# Patient Record
Sex: Male | Born: 1965 | Race: White | Hispanic: No | Smoking: Never smoker
Health system: Southern US, Community
[De-identification: ages and names within clinical notes are randomized; demographics above are authoritative.]

## PROBLEM LIST (undated history)

## (undated) DIAGNOSIS — E1165 Type 2 diabetes mellitus with hyperglycemia: Secondary | ICD-10-CM

## (undated) DIAGNOSIS — IMO0002 Reserved for concepts with insufficient information to code with codable children: Secondary | ICD-10-CM

## (undated) DIAGNOSIS — E785 Hyperlipidemia, unspecified: Secondary | ICD-10-CM

## (undated) DIAGNOSIS — F32A Depression, unspecified: Secondary | ICD-10-CM

## (undated) DIAGNOSIS — F329 Major depressive disorder, single episode, unspecified: Secondary | ICD-10-CM

## (undated) DIAGNOSIS — I10 Essential (primary) hypertension: Secondary | ICD-10-CM

## (undated) DIAGNOSIS — E119 Type 2 diabetes mellitus without complications: Secondary | ICD-10-CM

## (undated) HISTORY — DX: Essential (primary) hypertension: I10

## (undated) HISTORY — DX: Major depressive disorder, single episode, unspecified: F32.9

## (undated) HISTORY — PX: TONSILECTOMY/ADENOIDECTOMY WITH MYRINGOTOMY: SHX6125

## (undated) HISTORY — DX: Depression, unspecified: F32.A

## (undated) HISTORY — DX: Type 2 diabetes mellitus without complications: E11.9

## (undated) HISTORY — DX: Hyperlipidemia, unspecified: E78.5

---

## 2004-08-12 ENCOUNTER — Emergency Department: Payer: Self-pay | Admitting: Emergency Medicine

## 2005-09-05 ENCOUNTER — Emergency Department: Payer: Self-pay | Admitting: Emergency Medicine

## 2006-02-18 ENCOUNTER — Emergency Department: Payer: Self-pay | Admitting: Emergency Medicine

## 2006-07-30 ENCOUNTER — Other Ambulatory Visit: Payer: Self-pay

## 2006-07-30 ENCOUNTER — Emergency Department: Payer: Self-pay | Admitting: Emergency Medicine

## 2008-03-23 ENCOUNTER — Emergency Department: Payer: Self-pay | Admitting: Emergency Medicine

## 2009-12-07 IMAGING — CT CT HEAD WITHOUT CONTRAST
2 series · 16 of 30 positions shown, 20 images · non-contrast
Comparison: none

REASON FOR EXAM: facial droop
COMMENTS:

[Series 2: without · axial · non-contrast · 0.42mm/px · z∈[+399,+524]mm · 13 of 31 slices shown, 17 images]
[im 3/31  brain]
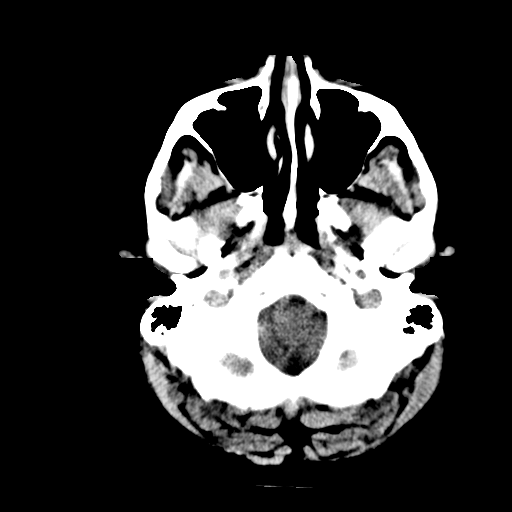
[im 3/31  bone]
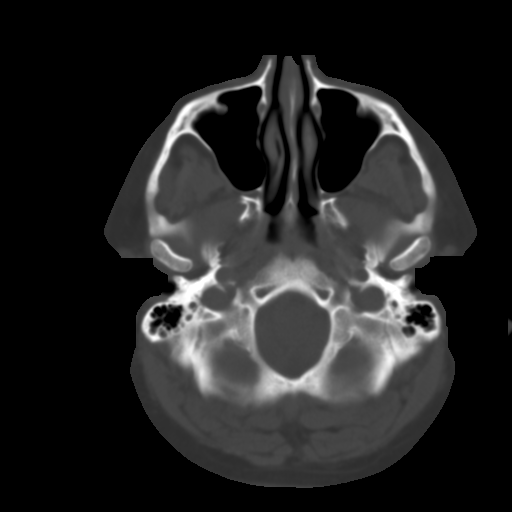
[im 5/31  brain]
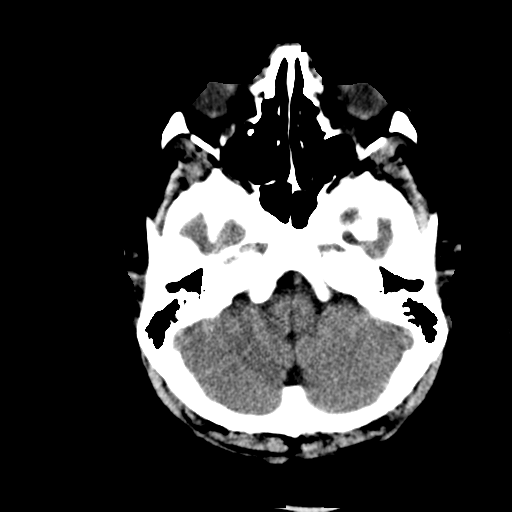
[im 7/31  brain]
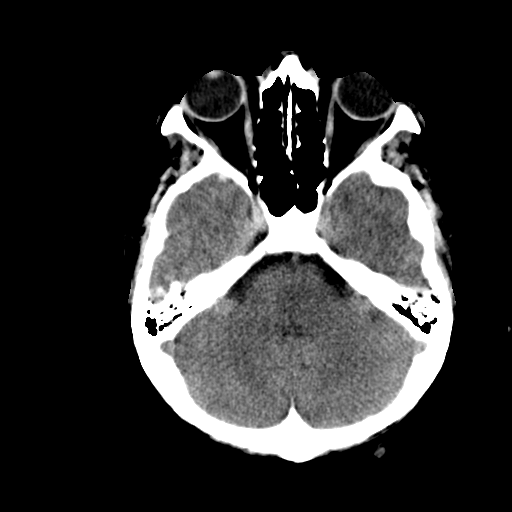
[im 9/31  brain]
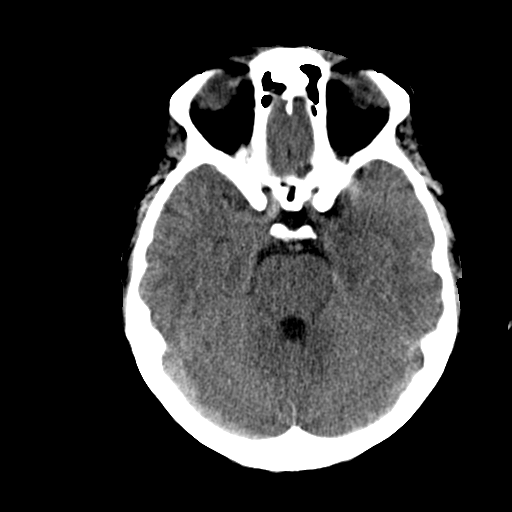
[im 11/31  brain]
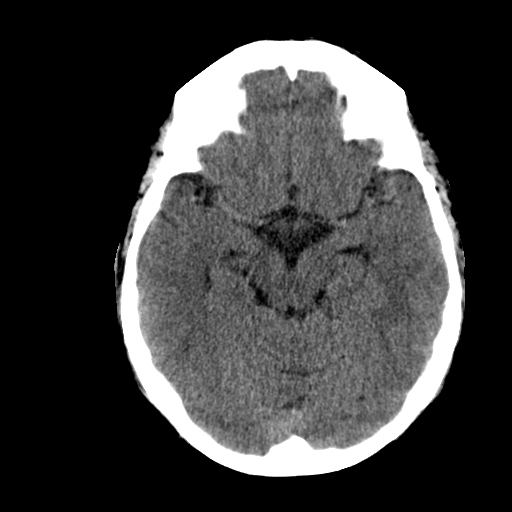
[im 11/31  bone]
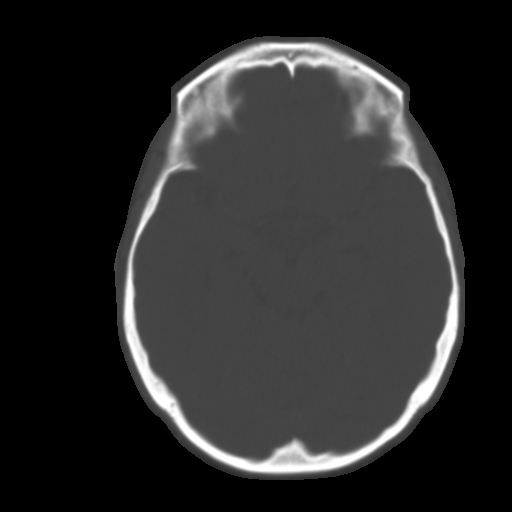
[im 13/31  brain]
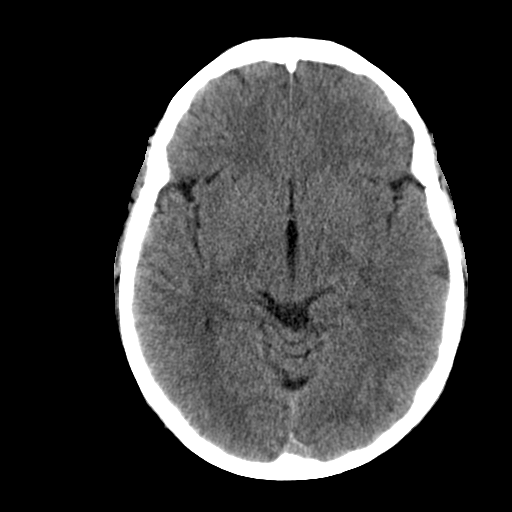
[im 16/31  brain]
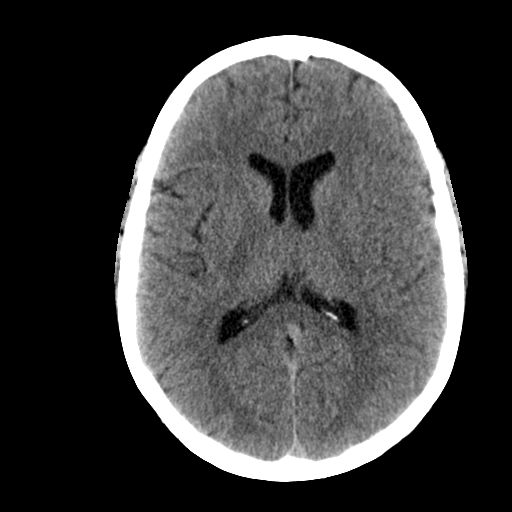
[im 18/31  brain]
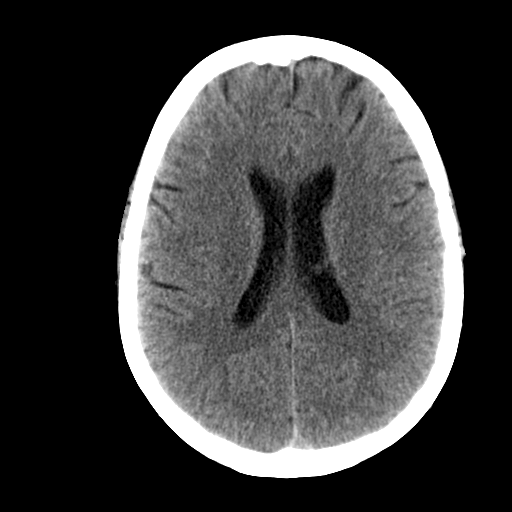
[im 20/31  brain]
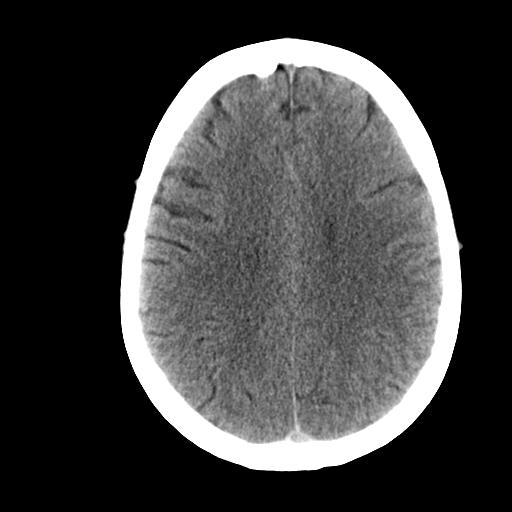
[im 20/31  bone]
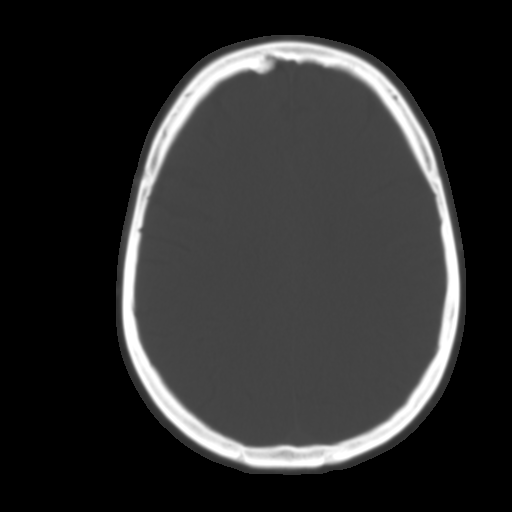
[im 22/31  brain]
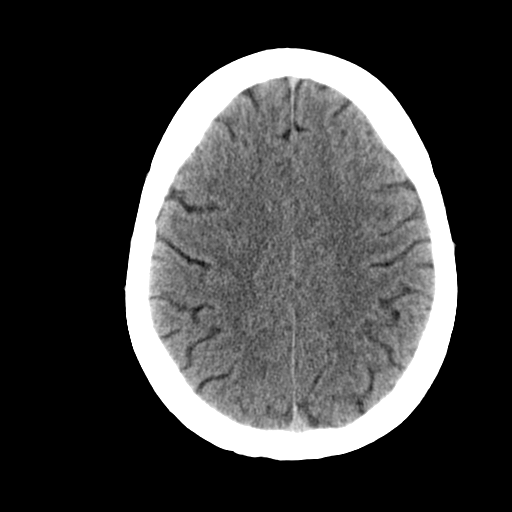
[im 24/31  brain]
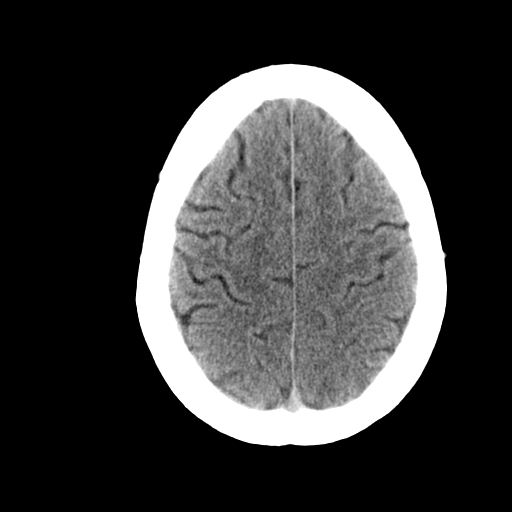
[im 26/31  brain]
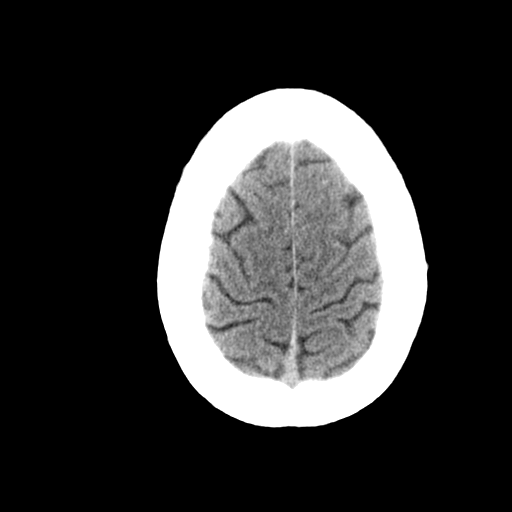
[im 28/31  brain]
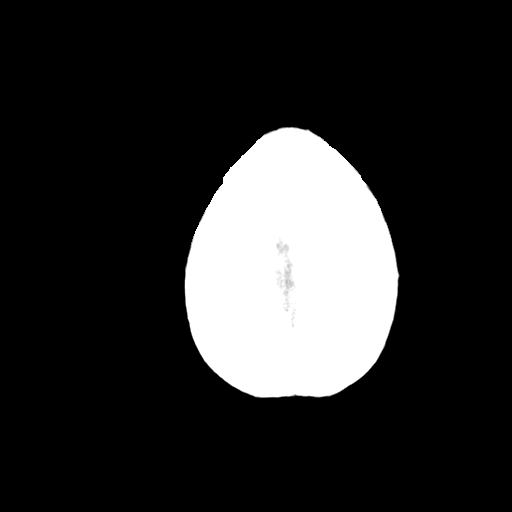
[im 28/31  bone]
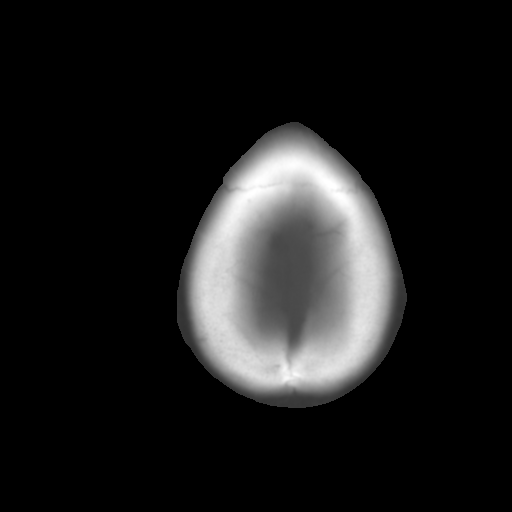

[Series 3: bone · axial · 0.42mm/px · z∈[+399,+439]mm · 3 of 31 slices shown]
[im 3/31  bone]
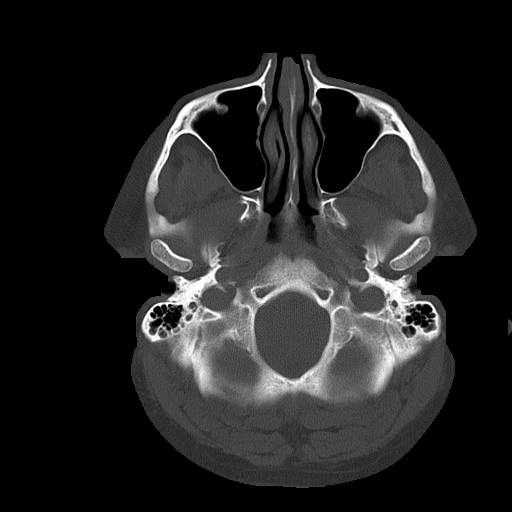
[im 7/31  bone]
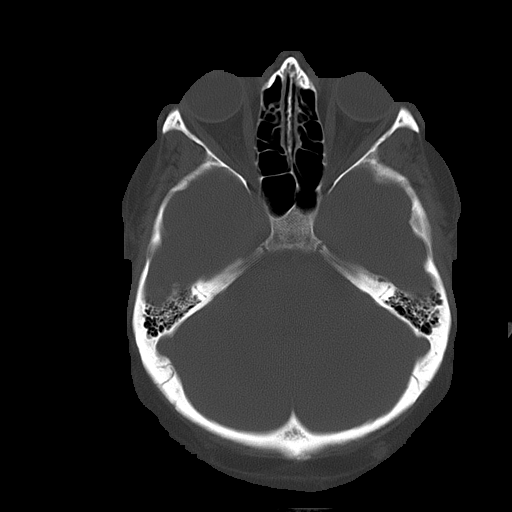
[im 11/31  bone]
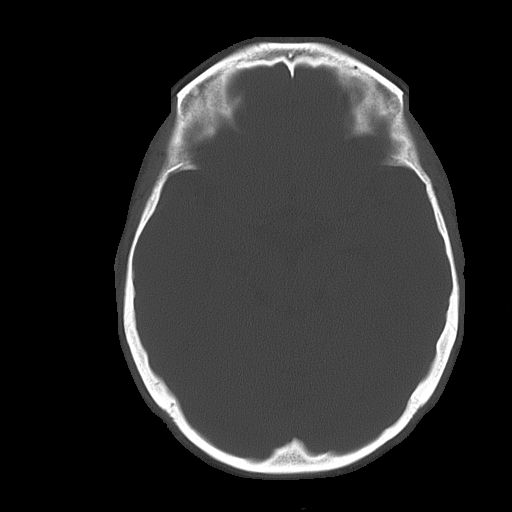

[16 of 30 positions shown; findings below may reference images not displayed]

PROCEDURE:     CT  - CT HEAD WITHOUT CONTRAST  - March 23, 2008  [DATE]

RESULT:     Noncontrast emergent CT of the brain is performed. The patient
has no previous exam for comparison.

The ventricles and sulci are normal. There is no hemorrhage. There is no
focal mass, mass-effect or midline shift. There is no evidence of edema or
territorial infarct. The bone windows demonstrate normal aeration of the
paranasal sinuses and mastoid air cells. There is no skull fracture
demonstrated.
IMPRESSION: 1. No acute intracranial abnormality.

## 2011-07-09 ENCOUNTER — Emergency Department: Payer: Self-pay | Admitting: Emergency Medicine

## 2011-07-09 LAB — URINALYSIS, COMPLETE
Bacteria: NONE SEEN
Blood: NEGATIVE
Ketone: NEGATIVE
Nitrite: NEGATIVE
Ph: 7 (ref 4.5–8.0)
Protein: NEGATIVE

## 2011-07-09 LAB — COMPREHENSIVE METABOLIC PANEL
BUN: 12 mg/dL (ref 7–18)
Bilirubin,Total: 0.4 mg/dL (ref 0.2–1.0)
Calcium, Total: 8.8 mg/dL (ref 8.5–10.1)
Chloride: 104 mmol/L (ref 98–107)
Creatinine: 0.71 mg/dL (ref 0.60–1.30)
EGFR (Non-African Amer.): >60

## 2011-07-09 LAB — LIPASE, BLOOD: Lipase: 75 U/L (ref 73–393)

## 2011-07-09 LAB — CBC
MCH: 29.6 pg (ref 26.0–34.0)
RDW: 13.1 % (ref 11.5–14.5)

## 2012-06-03 ENCOUNTER — Ambulatory Visit: Payer: Self-pay | Admitting: General Surgery

## 2012-06-07 ENCOUNTER — Ambulatory Visit: Payer: Self-pay | Admitting: General Surgery

## 2012-06-23 ENCOUNTER — Ambulatory Visit: Payer: Self-pay | Admitting: General Surgery

## 2012-07-19 ENCOUNTER — Ambulatory Visit: Payer: Self-pay | Admitting: General Surgery

## 2012-08-02 ENCOUNTER — Ambulatory Visit (INDEPENDENT_AMBULATORY_CARE_PROVIDER_SITE_OTHER): Payer: BC Managed Care – PPO | Admitting: General Surgery

## 2012-08-02 ENCOUNTER — Encounter: Payer: Self-pay | Admitting: General Surgery

## 2012-08-02 VITALS — BP 140/70 | HR 70 | Resp 16 | Ht 64.0 in | Wt 369.0 lb

## 2012-08-02 DIAGNOSIS — L987 Excessive and redundant skin and subcutaneous tissue: Secondary | ICD-10-CM

## 2012-08-02 DIAGNOSIS — L918 Other hypertrophic disorders of the skin: Secondary | ICD-10-CM

## 2012-08-02 NOTE — Patient Instructions (Addendum)
Patient to see a Engineer, petroleum. Have asked pt to discuss with his PCP.

## 2012-08-02 NOTE — Progress Notes (Signed)
Patient ID: Jack Owens, male   DOB: [birth year], 47 y.o.   MRN: [id number]  Chief Complaint  Patient presents with  . Abdominal Pain    HPI Jack Owens is a 47 y.o. male  Here for assessment of left leg and groin mass. He reports has had this for the past 5 years. Patient states the area does change size. Patient states the pain comes and goes and is a 5 to 6 on the pain scale. Pt is obese and has lost 80lbs over past 1-2 yrs. He noted the swelling become more pronounced with wt loss. Abdominal Pain    Past Medical History  Diagnosis Date  . Diabetes mellitus without complication   . Hyperlipidemia   . Hypertension   . Depression     Past Surgical History  Procedure Laterality Date  . Tonsilectomy/adenoidectomy with myringotomy  as child    History reviewed. No pertinent family history.  Social History History  Substance Use Topics  . Smoking status: Never Smoker   . Smokeless tobacco: Never Used  . Alcohol Use: No    No Known Allergies  Current Outpatient Prescriptions  Medication Sig Dispense Refill  . citalopram (CELEXA) 20 MG tablet Take 20 mg by mouth daily.      . hydrochlorothiazide (HYDRODIURIL) 25 MG tablet Take 25 mg by mouth daily.      Marland Kitchen lovastatin (MEVACOR) 40 MG tablet Take 40 mg by mouth at bedtime.      . metFORMIN (GLUCOPHAGE) 500 MG tablet Take 500 mg by mouth 2 (two) times daily with a meal.       No current facility-administered medications for this visit.    Review of Systems Review of Systems  Constitutional: Negative.   Respiratory: Negative.   Cardiovascular: Negative.   Gastrointestinal: Negative.     Blood pressure 140/70, pulse 70, resp. rate 16, height 5\' 4"  (1.626 m), weight 369 lb (167.377 kg).  Physical Exam Physical Exam  Constitutional: He is oriented to person, place, and time. He appears well-developed and well-nourished.  Messily obesity   Cardiovascular: Intact distal pulses and normal pulses.   Pulses:      Dorsalis  pedis pulses are 2+ on the right side, and 2+ on the left side.       Posterior tibial pulses are 2+ on the right side, and 2+ on the left side.  No edema   Abdominal: Soft. Normal appearance and bowel sounds are normal. There is no tenderness.  Neurological: He is alert and oriented to person, place, and time.  Skin: Skin is warm and dry.  There is a very large pendulous fold of subcutaneous tisue in left left groin, between scrotum and inner left thigh. Easily in excess of 20 cm diameter.   Data Reviewed none Assessment     It is understandable the ;large redundant fold in ths obese individual coud be causing local discomfort with walking. However this is more in line of plastic surgery to assess.      Plan    Patient to see a Engineer, petroleum. Advised in full.         Lysandra Loughmiller G 08/03/2012, 6:48 AM

## 2012-08-03 ENCOUNTER — Encounter: Payer: Self-pay | Admitting: General Surgery

## 2012-08-03 DIAGNOSIS — L987 Excessive and redundant skin and subcutaneous tissue: Secondary | ICD-10-CM | POA: Insufficient documentation

## 2013-03-24 IMAGING — US ABDOMEN ULTRASOUND LIMITED
1 series · 7 of 7 positions shown · non-contrast
Comparison: none

REASON FOR EXAM: rlq
COMMENTS:   Body Site: Appendix/Bowel

PROCEDURE:     US  - US ABDOMEN LIMITED SURVEY  - July 09, 2011  [DATE]
RESULT:

[Series 1: abdomen ultrasound limited · 0.35mm/px · 7 of 7 slices shown]
[im 1/7]
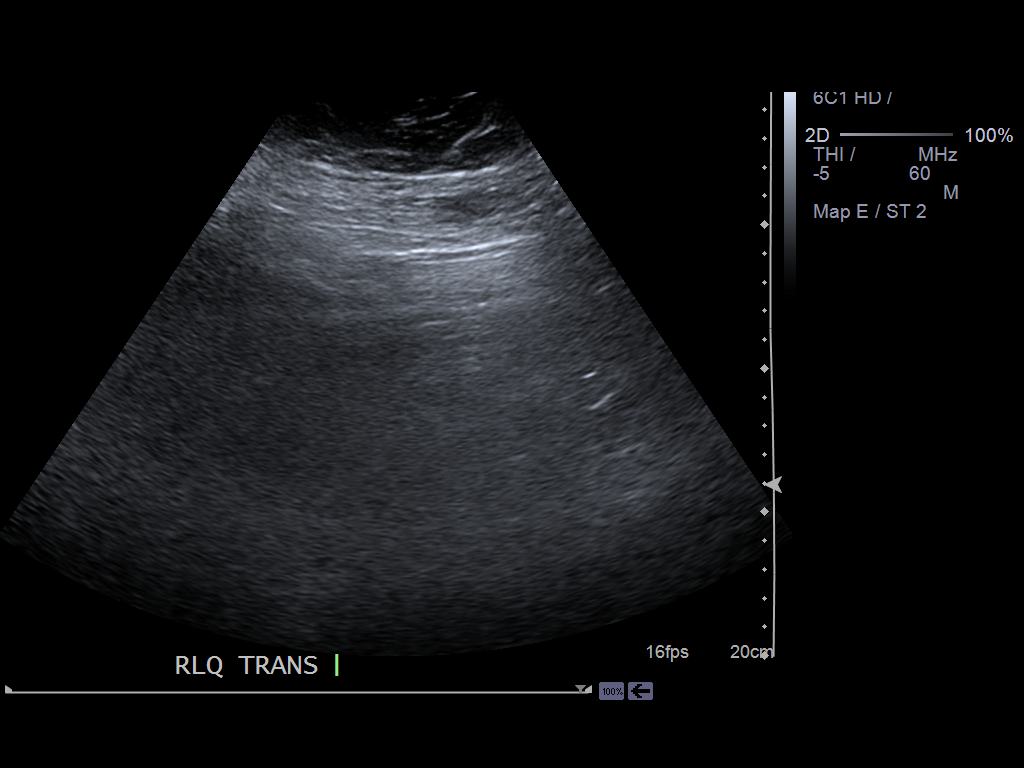
[im 2/7]
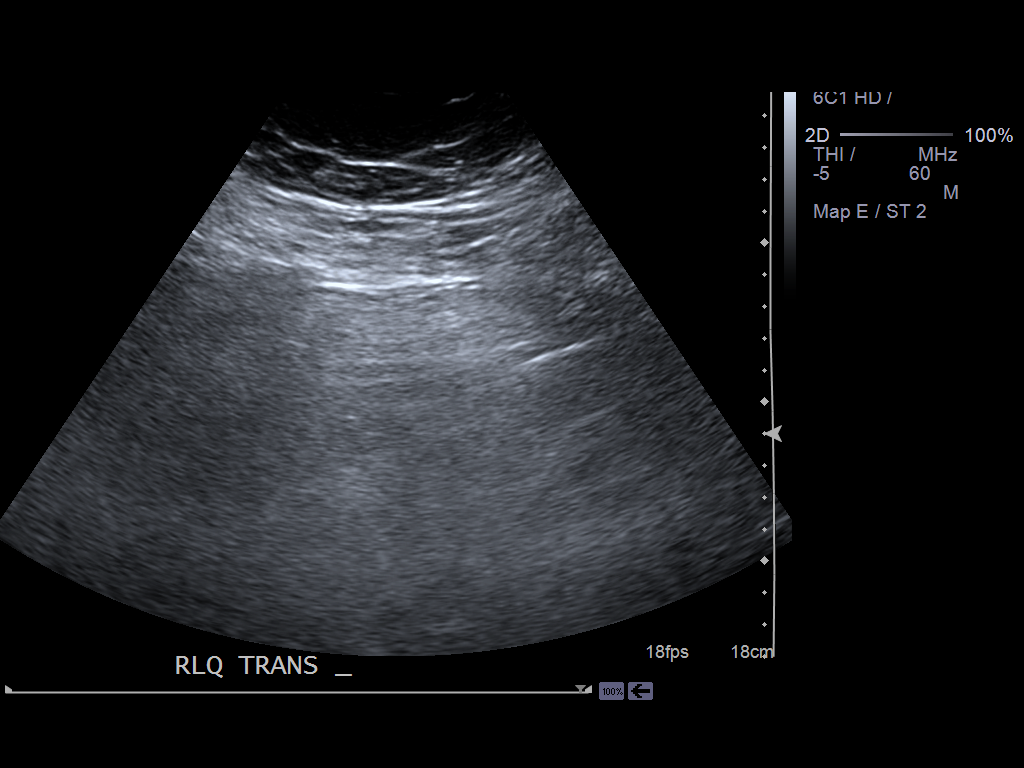
[im 3/7]
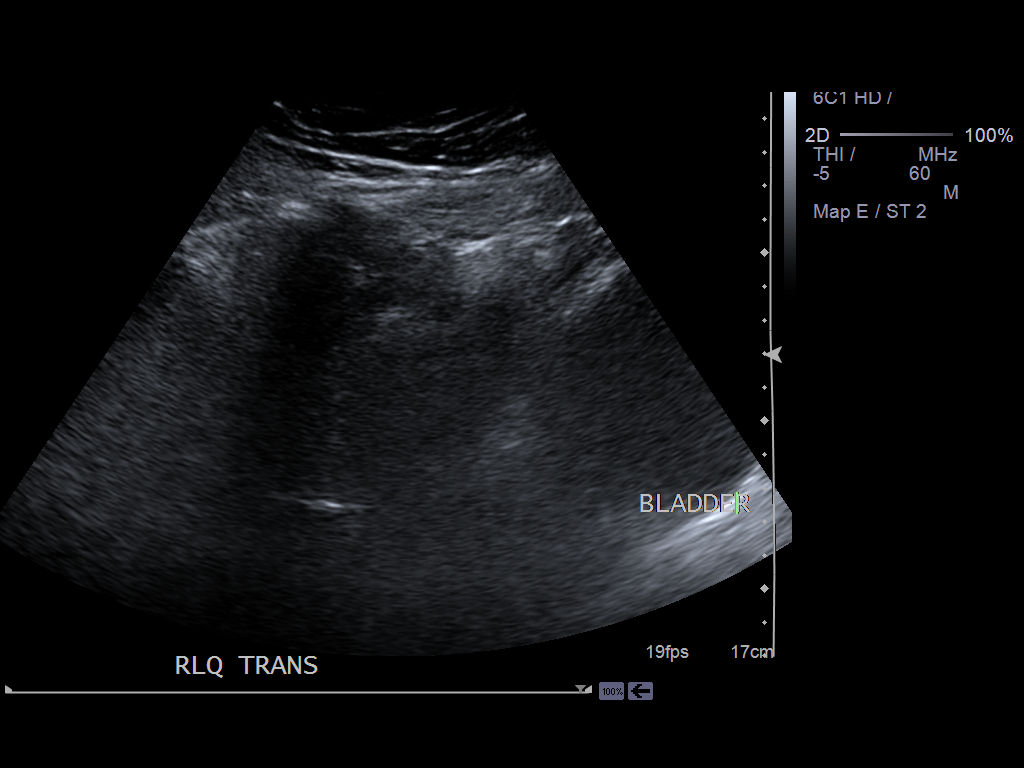
[im 4/7]
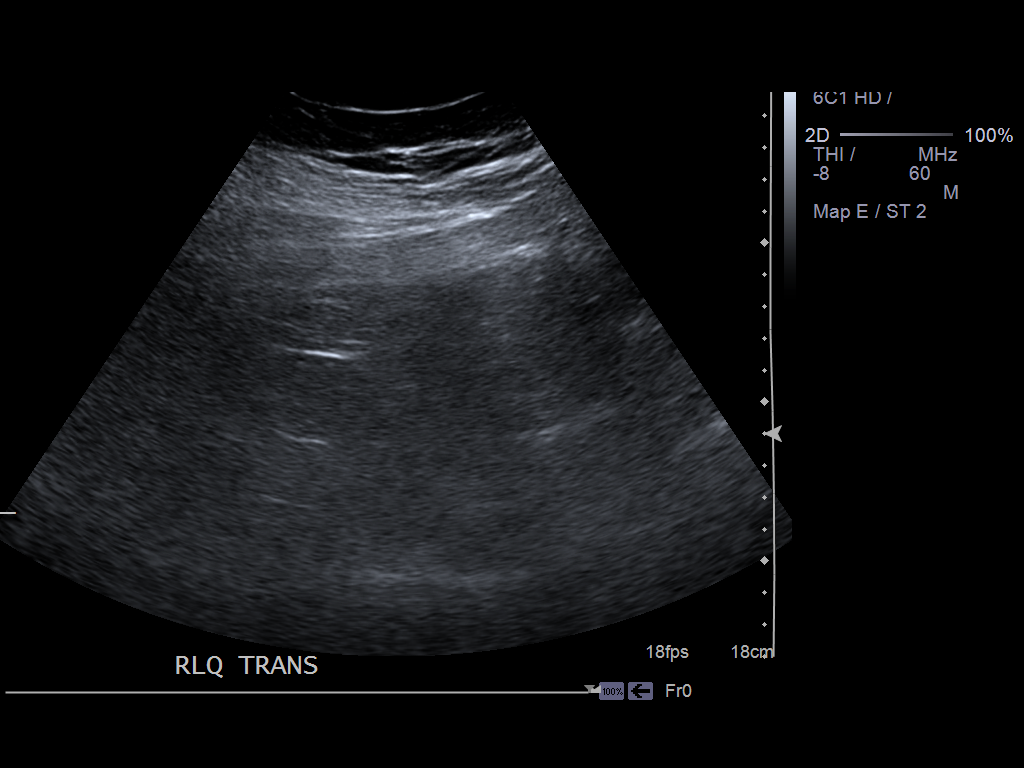
[im 5/7]
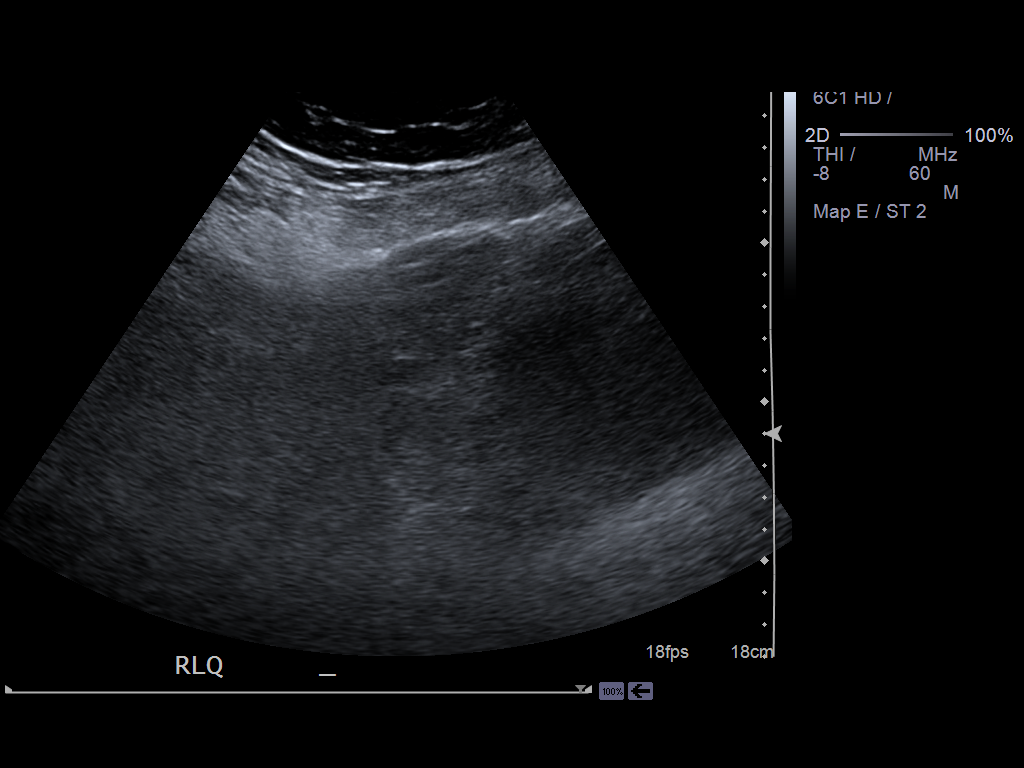
[im 6/7]
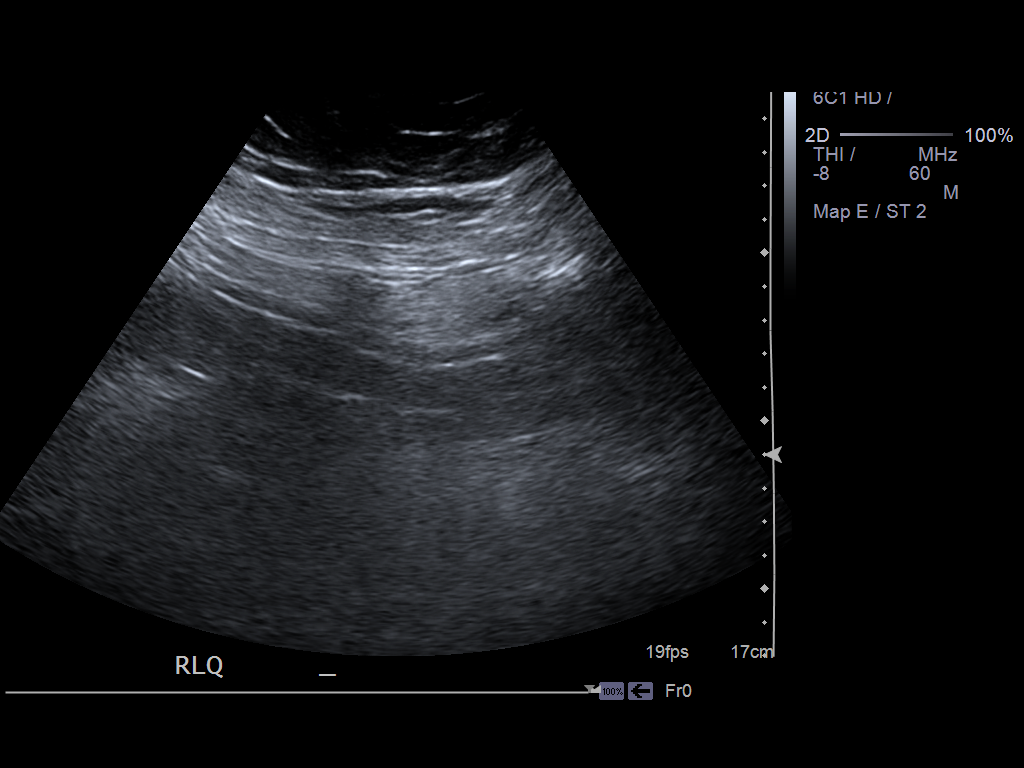
[im 7/7]
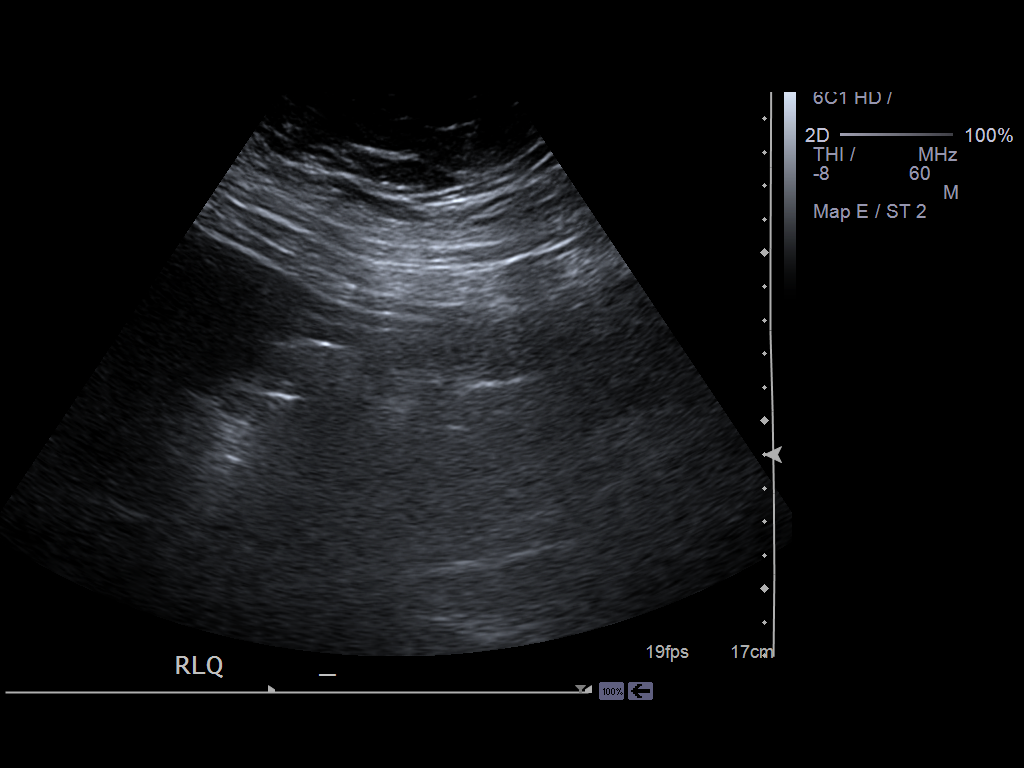

[7 of 7 positions shown; findings below may reference images not displayed]

FINDINGS: Evaluation of the right lower quadrant demonstrates no
sonographically appreciable recognizable structures. This is likely due to
the patient's body habitus, the patient is morbidly obese.
IMPRESSION: Limited right lower quadrant abdominal ultrasound.

## 2013-03-24 IMAGING — US ABDOMEN ULTRASOUND
1 series · 13 of 25 positions shown · non-contrast
Comparison: none

REASON FOR EXAM: rlq pain, possible appy, kidney stone etc.  pt too big
for scanner
COMMENTS:   May transport without cardiac monitor

PROCEDURE:     US  - US ABDOMEN GENERAL SURVEY  - July 09, 2011  [DATE]
RESULT:     Abdominal ultrasound dated 07/09/2011.

[Series 1: abdomen ultrasound · 0.38mm/px · 13 of 84 slices shown]
[im 1/84]
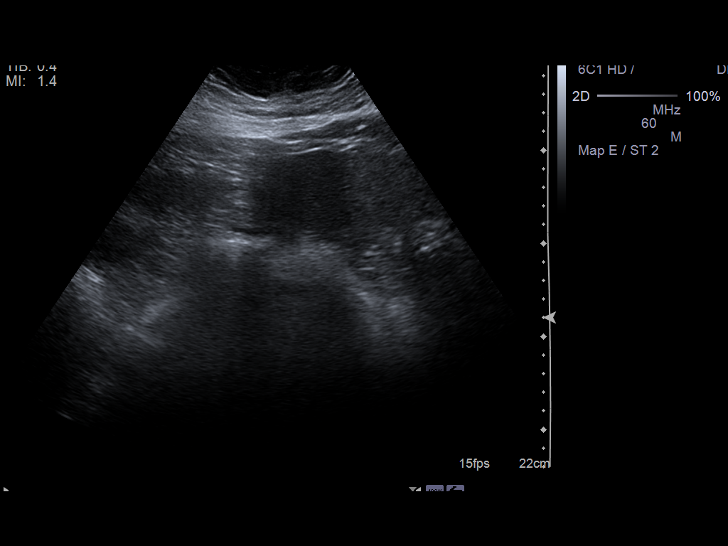
[im 7/84]
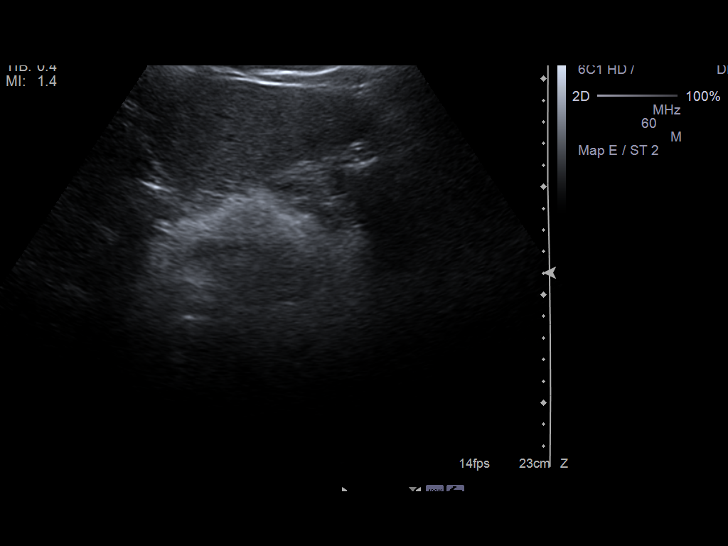
[im 14/84]
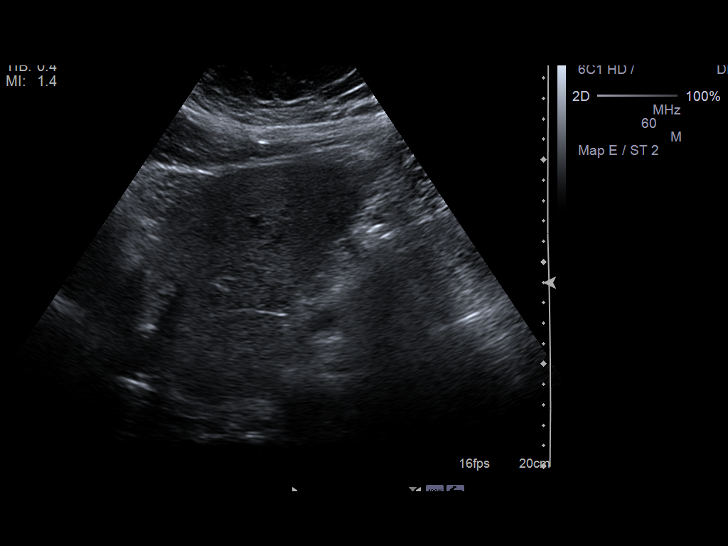
[im 21/84]
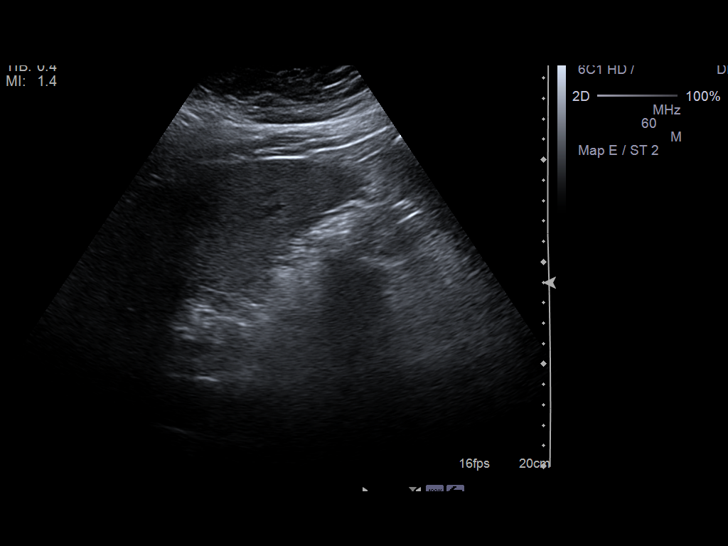
[im 28/84]
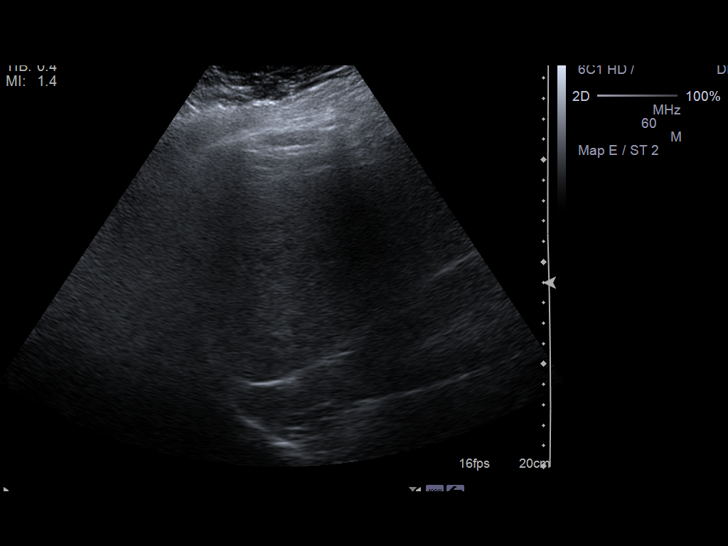
[im 35/84]
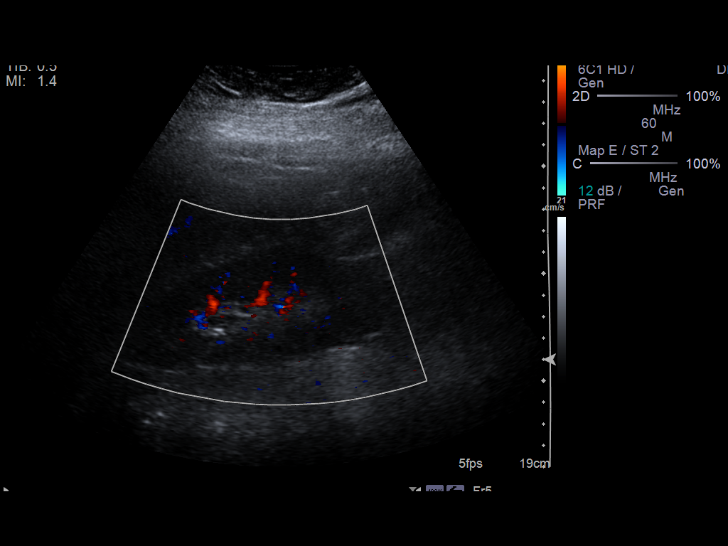
[im 42/84]
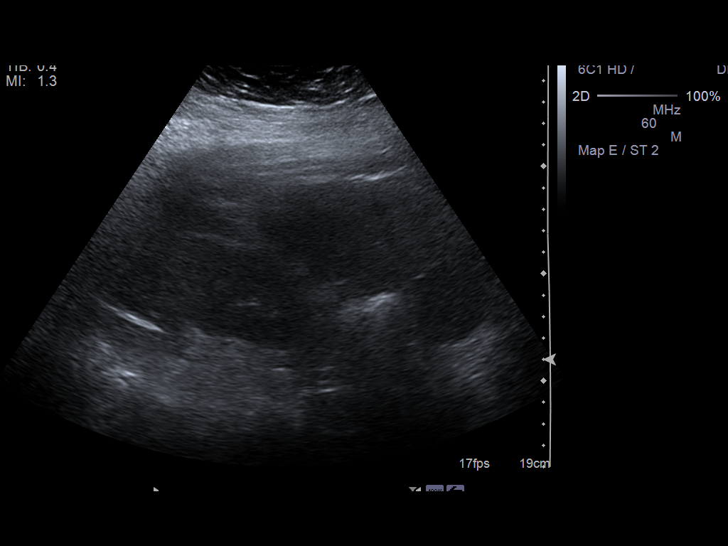
[im 49/84]
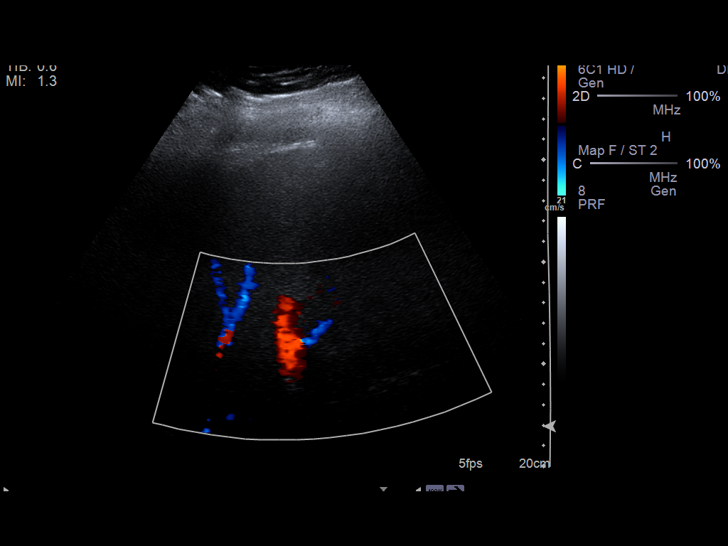
[im 56/84]
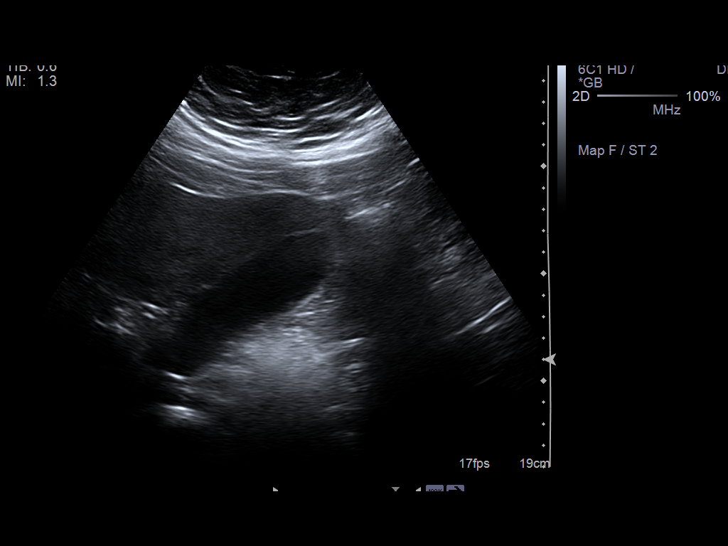
[im 63/84]
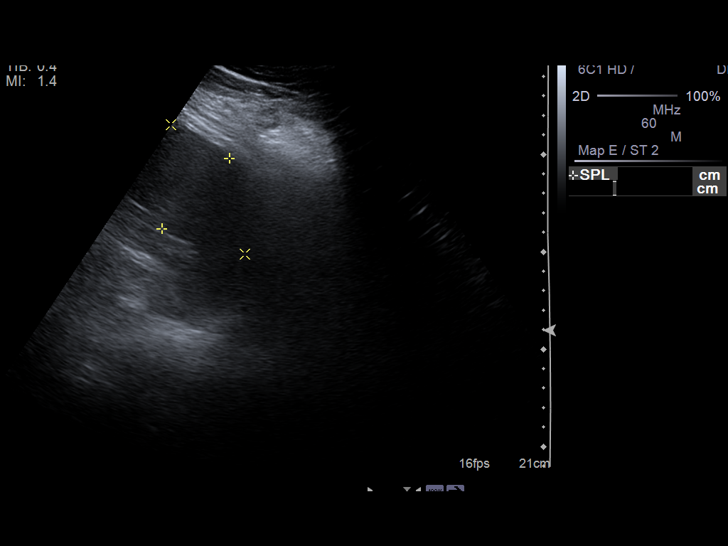
[im 70/84]
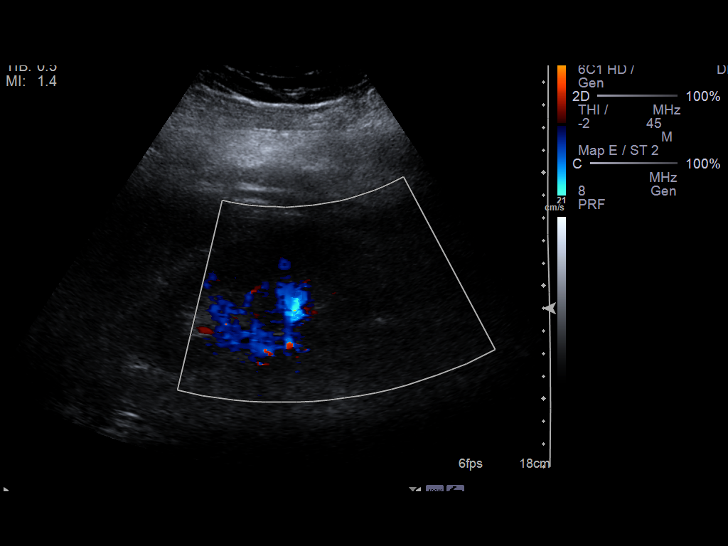
[im 77/84]
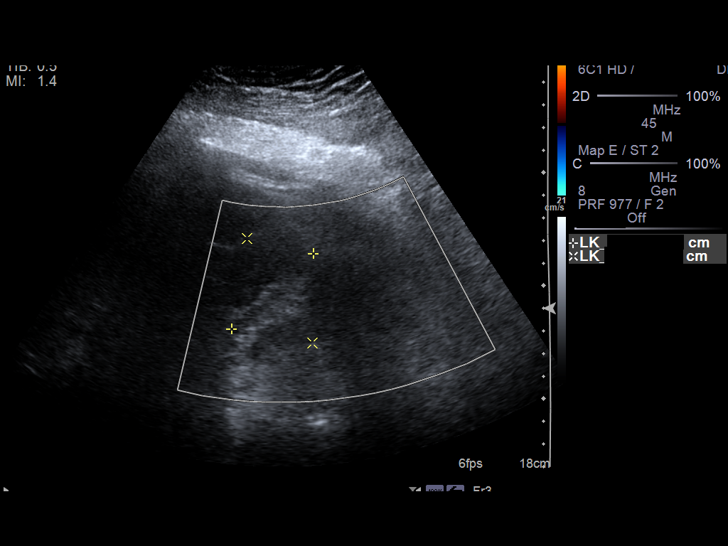
[im 84/84]
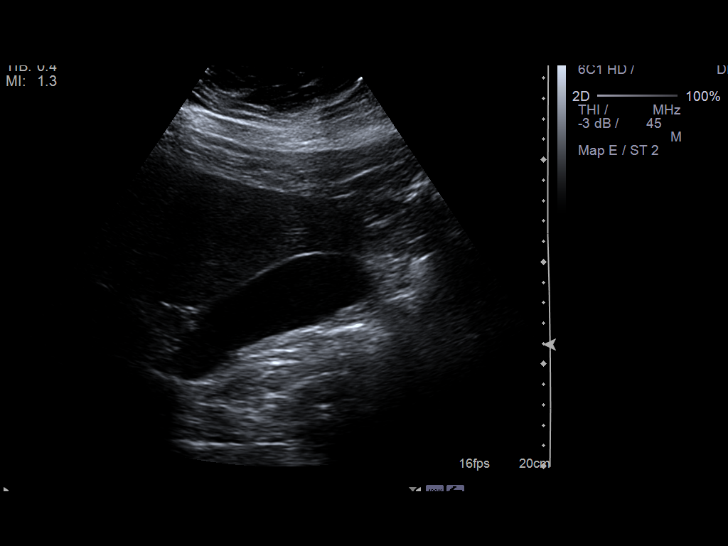

[13 of 25 positions shown; findings below may reference images not displayed]

FINDINGS: The study is markedly degraded secondary to patient body habitus
patient is morbidly obese. The liver demonstrates a heterogeneous
echotexture. The hip no pedal flow is identified within the portal vein.
Visualization of the pancreas is unremarkable. The gallbladder fossa
demonstrates no evidence of pericholecystic fluid, gallstones, gallbladder
wall thickening, sludging nor a sonographic Murphy's sign. Gallbladder wall
thickness is 2.3 mm and the common bile that measures 4.3 mm in diameter.
There is no evidence of intra or extra hepatic biliary ductal dilatation.
Evaluation of the kidneys demonstrates no evidence of hydronephrosis nor
gross evidence of solid or cystic masses. The right kidney measures 12.94 x
4.9 x 4.74 cm and the left 11.44 x 4.92 x 5.5 cm. The spleen is homogeneous
in echotexture measures 10.58 cm in longitudinal dimensions.
IMPRESSION: Hepatic steatosis otherwise unremarkable abdominal
ultrasound.

## 2018-08-24 ENCOUNTER — Ambulatory Visit: Attending: Internal Medicine | Primary: Internal Medicine

## 2018-08-24 ENCOUNTER — Ambulatory Visit: Admit: 2018-08-24 | Attending: Internal Medicine | Primary: Internal Medicine

## 2018-08-24 DIAGNOSIS — Z0000 Encounter for general adult medical examination without abnormal findings: Secondary | ICD-10-CM

## 2018-08-24 LAB — AMB POC URINALYSIS DIP STICK MANUAL W/O MICRO
Bilirubin (UA POC): NEGATIVE
Bilirubin, Urine, POC: NEGATIVE
Blood (UA POC): NEGATIVE
Blood (UA POC): NEGATIVE
Leukocyte Esterase, Urine, POC: NEGATIVE
Leukocyte esterase (UA POC): NEGATIVE
Nitrite, Urine, POC: NEGATIVE
Nitrites (UA POC): NEGATIVE
Protein (UA POC): NEGATIVE
Protein, Urine, POC: NEGATIVE
Specific Gravity, Urine, POC: 1.01 NA (ref 1.001–1.035)
Specific gravity (UA POC): 1.01 (ref 1.001–1.035)
Urobilinogen (UA POC): 0.2 (ref 0.2–1)
Urobilinogen, POC: 0.2 (ref 0.2–1)
pH (UA POC): 5 (ref 4.6–8.0)
pH, Urine, POC: 5 NA (ref 4.6–8.0)

## 2018-08-24 MED ORDER — JANUMET 50 MG-1,000 MG TABLET
50-1000 mg | ORAL_TABLET | Freq: Two times a day (BID) | ORAL | 1 refills | Status: AC
Start: 2018-08-24 — End: ?

## 2018-08-24 NOTE — Progress Notes (Signed)
History and Physical    Samuel Eaton is a 53 y.o. male presents for physical exam.  Was living in [place] for the last 3 yrs, then returned to states 2 yrs. Ago.  Has not had medical insurance until January. Has not had a colonoscopy.    Past Medical History:   Diagnosis Date   ??? Diabetes (HCC)    ??? Neuropathy      History reviewed. No pertinent surgical history.    Not on File  Social History     Socioeconomic History   ??? Marital status: UNKNOWN     Spouse name: Not on file   ??? Number of children: Not on file   ??? Years of education: Not on file   ??? Highest education level: Not on file   Occupational History   ??? Occupation: sales   Tobacco Use   ??? Smoking status: Never Smoker   ??? Smokeless tobacco: Never Used   Substance and Sexual Activity   ??? Alcohol use: Yes     Alcohol/week: 2.0 standard drinks     Types: 2 Glasses of wine per week     Frequency: Monthly or less     Family History   Problem Relation Age of Onset   ??? Lung Cancer Mother         smoker   ??? Uterine Cancer Mother    ??? Melanoma Mother    ??? Breast Cancer Mother    ??? Other Father         histamine problem- angioedema   ??? Alcohol abuse Father    ??? Other Maternal Grandfather         meningioma     The patient does not have a history of falls. A plan of care for falls was documented..  Depression screen neg    Review of Systems:  occas. Dysphagia,- can not turn head while eating or he will choke, chest pain, or SOB.  No nausea or vomiting, or diarrhea or constipation.  No fever, chills, night sweats, or cough.  No dysuria, frequency or abdominal pain.  No headaches.      Yearly eye exam:   yes  Yearly dental exam: no    Objective  Visit Vitals  BP 130/90   Temp 97.3 ??F (36.3 ??C)   Ht 6' 1.5" (1.867 m)   Wt 235 lb (106.6 kg)   BMI 30.58 kg/m??     General appearance - well developed, well nourished 53 y.o. wm- nad  Eyes - PERRL  Tm- intact pharynx- no lesions  Neck --Supple, no anterior cervical nodes, no thyromegaly. No bruits  Lungs --CTA  Heart - Regular rate  and rhythm  Abdomen - soft, no tenderness or distention  Breasts - no masses, no axillary nodes  Extremities - no clubbing, cyanosis or edema, good pulses, no lesions    Results for orders placed or performed in visit on 08/24/18   AMB POC URINALYSIS DIP STICK MANUAL W/O MICRO   Result Value Ref Range    Color (UA POC) Yellow     Clarity (UA POC) Clear     Glucose (UA POC) 4+ Negative    Bilirubin (UA POC) Negative Negative    Ketones (UA POC) Trace Negative    Specific gravity (UA POC) 1.010 1.001 - 1.035    Blood (UA POC) Negative Negative    pH (UA POC) 5.0 4.6 - 8.0    Protein (UA POC) Negative Negative    Urobilinogen (UA  POC) 0.2 mg/dL 0.2 - 1    Nitrites (UA POC) Negative Negative    Leukocyte esterase (UA POC) Negative Negative         Assessment/Plan      ICD-10-CM ICD-9-CM    1. 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 Dysphagia, unspecified type  R13.10 787.20 REFERRAL TO GASTROENTEROLOGY   4. Need for Tdap vaccination  Z23 V06.1 TETANUS, DIPHTHERIA TOXOIDS AND ACELLULAR PERTUSSIS VACCINE (TDAP), IN INDIVIDS. >=7, IM   5. Type 2 diabetes mellitus with hyperglycemia, without long-term current use of insulin (HCC)  E11.65 250.00 SITagliptin-metFORMIN (Janumet) 50-1,000 mg per tablet     790.29    shingrix- will mail rx.  Consider pnvx next appt  Flu shot in Fall  Work on diet and exercise  Follow up one month  Restart janumet  cologuard  Gi referral for dysphagia    AUTHOR:  Genene Churn, MD 08/24/2018 2:50 PM

## 2018-08-24 NOTE — Patient Instructions (Addendum)
Results for orders placed or performed in visit on 08/24/18   AMB POC URINALYSIS DIP STICK MANUAL W/O MICRO   Result Value Ref Range    Color (UA POC) Yellow     Clarity (UA POC) Clear     Glucose (UA POC) 4+ Negative    Bilirubin (UA POC) Negative Negative    Ketones (UA POC) Trace Negative    Specific gravity (UA POC) 1.010 1.001 - 1.035    Blood (UA POC) Negative Negative    pH (UA POC) 5.0 4.6 - 8.0    Protein (UA POC) Negative Negative    Urobilinogen (UA POC) 0.2 mg/dL 0.2 - 1    Nitrites (UA POC) Negative Negative    Leukocyte esterase (UA POC) Negative Negative     Flu shot in Fall.  Restart janumet.  Follow up one month

## 2018-08-24 NOTE — Progress Notes (Signed)
History and Physical    Samuel Eaton is a 53 y.o. male presents for physical exam.  Was living in [place] for the last 3 yrs, then returned to states 2 yrs. Ago.  Has not had medical insurance until January. Has not had a colonoscopy.    Past Medical History:   Diagnosis Date   ??? Diabetes ([place])    ??? Neuropathy      History reviewed. No pertinent surgical history.    Not on File  Social History     Socioeconomic History   ??? Marital status: UNKNOWN     Spouse name: Not on file   ??? Number of children: Not on file   ??? Years of education: Not on file   ??? Highest education level: Not on file   Occupational History   ??? Occupation: sales   Tobacco Use   ??? Smoking status: Never Smoker   ??? Smokeless tobacco: Never Used   Substance and Sexual Activity   ??? Alcohol use: Yes     Alcohol/week: 2.0 standard drinks     Types: 2 Glasses of wine per week     Frequency: Monthly or less     Family History   Problem Relation Age of Onset   ??? Lung Cancer Mother         smoker   ??? Uterine Cancer Mother    ??? Melanoma Mother    ??? Breast Cancer Mother    ??? Other Father         histamine problem- angioedema   ??? Alcohol abuse Father    ??? Other Maternal Grandfather         meningioma     The patient does not have a history of falls. A plan of care for falls was documented..  Depression screen neg    Review of Systems:  occas. Dysphagia,- can not turn head while eating or he will choke, chest pain, or SOB.  No nausea or vomiting, or diarrhea or constipation.  No fever, chills, night sweats, or cough.  No dysuria, frequency or abdominal pain.  No headaches.      Yearly eye exam:   yes  Yearly dental exam: no    Objective  Visit Vitals  BP 130/90   Temp 97.3 ??F (36.3 ??C)   Ht 6' 1.5" (1.867 m)   Wt 235 lb (106.6 kg)   BMI 30.58 kg/m??     General appearance - well developed, well nourished 53 y.o. wm- nad  Eyes - PERRL  Tm- intact pharynx- no lesions  Neck --Supple, no anterior cervical nodes, no thyromegaly. No bruits  Lungs --CTA   Heart - Regular rate and rhythm  Abdomen - soft, no tenderness or distention  Breasts - no masses, no axillary nodes  Extremities - no clubbing, cyanosis or edema, good pulses, no lesions    Results for orders placed or performed in visit on 08/24/18   AMB POC URINALYSIS DIP STICK MANUAL W/O MICRO   Result Value Ref Range    Color (UA POC) Yellow     Clarity (UA POC) Clear     Glucose (UA POC) 4+ Negative    Bilirubin (UA POC) Negative Negative    Ketones (UA POC) Trace Negative    Specific gravity (UA POC) 1.010 1.001 - 1.035    Blood (UA POC) Negative Negative    pH (UA POC) 5.0 4.6 - 8.0    Protein (UA POC) Negative Negative    Urobilinogen (UA  POC) 0.2 mg/dL 0.2 - 1    Nitrites (UA POC) Negative Negative    Leukocyte esterase (UA POC) Negative Negative         Assessment/Plan      ICD-10-CM ICD-9-CM    1. 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 Dysphagia, unspecified type  R13.10 787.20 REFERRAL TO GASTROENTEROLOGY   4. Need for Tdap vaccination  Z23 V06.1 TETANUS, DIPHTHERIA TOXOIDS AND ACELLULAR PERTUSSIS VACCINE (TDAP), IN INDIVIDS. >=7, IM   5. Type 2 diabetes mellitus with hyperglycemia, without long-term current use of insulin (HCC)  E11.65 250.00 SITagliptin-metFORMIN (Janumet) 50-1,000 mg per tablet     790.29    shingrix- will mail rx.  Consider pnvx next appt  Flu shot in Fall  Work on diet and exercise  Follow up one month  Restart janumet  cologuard  Gi referral for dysphagia    AUTHOR:  Genene Churn, MD 08/24/2018 2:50 PM

## 2018-08-26 LAB — COMPREHENSIVE METABOLIC PANEL
ALT: 17 IU/L (ref 0–44)
AST: 13 IU/L (ref 0–40)
Albumin/Globulin Ratio: 1.9 NA (ref 1.2–2.2)
Albumin: 4.5 g/dL (ref 3.8–4.9)
Alkaline Phosphatase: 82 IU/L (ref 39–117)
BUN: 18 mg/dL (ref 6–24)
Bun/Cre Ratio: 19 NA (ref 9–20)
CO2: 22 mmol/L (ref 20–29)
Calcium: 9.4 mg/dL (ref 8.7–10.2)
Chloride: 101 mmol/L (ref 96–106)
Creatinine: 0.94 mg/dL (ref 0.76–1.27)
EGFR IF NonAfrican American: 92 mL/min/{1.73_m2} (ref >59)
GFR African American: 107 mL/min/{1.73_m2} (ref >59)
Globulin, Total: 2.4 g/dL (ref 1.5–4.5)
Glucose: 381 mg/dL — ABNORMAL HIGH (ref 65–99)
Potassium: 4.6 mmol/L (ref 3.5–5.2)
Sodium: 140 mmol/L (ref 134–144)
Total Bilirubin: 0.5 mg/dL (ref 0.0–1.2)
Total Protein: 6.9 g/dL (ref 6.0–8.5)

## 2018-08-26 LAB — LIPID PANEL
Cholesterol, Total: 183 mg/dL (ref 100–199)
Cholesterol, total: 183 mg/dL (ref 100–199)
HDL Cholesterol: 53 mg/dL (ref >39)
HDL: 53 mg/dL (ref >39)
LDL Calculated: 107 mg/dL — ABNORMAL HIGH (ref 0–99)
LDL, calculated: 107 mg/dL — ABNORMAL HIGH (ref 0–99)
Triglyceride: 117 mg/dL (ref 0–149)
Triglycerides: 117 mg/dL (ref 0–149)
VLDL Cholesterol Calculated: 23 mg/dL (ref 5–40)
VLDL, calculated: 23 mg/dL (ref 5–40)

## 2018-08-26 LAB — CBC WITH AUTO DIFFERENTIAL
Basophils %: 1 %
Basophils Absolute: 0.1 10*3/uL (ref 0.0–0.2)
Eosinophils %: 4 %
Eosinophils Absolute: 0.3 10*3/uL (ref 0.0–0.4)
Granulocyte Absolute Count: 0 10*3/uL (ref 0.0–0.1)
Hematocrit: 51.6 % — ABNORMAL HIGH (ref 37.5–51.0)
Hemoglobin: 17.3 g/dL (ref 13.0–17.7)
Immature Granulocytes: 0 %
Lymphocytes %: 29 %
Lymphocytes Absolute: 1.8 10*3/uL (ref 0.7–3.1)
MCH: 30.4 pg (ref 26.6–33.0)
MCHC: 33.5 g/dL (ref 31.5–35.7)
MCV: 91 fL (ref 79–97)
Monocytes %: 8 %
Monocytes Absolute: 0.5 10*3/uL (ref 0.1–0.9)
Neutrophils %: 58 %
Neutrophils Absolute: 3.5 10*3/uL (ref 1.4–7.0)
Platelets: 245 10*3/uL (ref 150–450)
RBC: 5.69 x10E6/uL (ref 4.14–5.80)
RDW: 12.2 % (ref 11.6–15.4)
WBC: 6.2 10*3/uL (ref 3.4–10.8)

## 2018-08-26 LAB — PSA PROSTATIC SPECIFIC ANTIGEN: PSA: 0.6 ng/mL (ref 0.0–4.0)

## 2018-08-26 LAB — HEMOGLOBIN A1C W/EAG
Hemoglobin A1C: 11.4 % — ABNORMAL HIGH (ref 4.8–5.6)
eAG: 280 mg/dL

## 2018-08-26 LAB — TSH 3RD GENERATION
TSH: 1.97 u[IU]/mL (ref 0.450–4.500)
TSH: 1.97 u[IU]/mL (ref 0.450–4.500)

## 2018-08-26 LAB — TESTOSTERONE, FREE
FREE TESTOSTERONE,DIRECT, 144981: 13.3 pg/mL (ref 7.2–24.0)
Free testosterone (Direct): 13.3 pg/mL (ref 7.2–24.0)

## 2018-08-26 LAB — CBC WITH AUTOMATED DIFF
ABS. BASOPHILS: 0.1 10*3/uL (ref 0.0–0.2)
ABS. EOSINOPHILS: 0.3 10*3/uL (ref 0.0–0.4)
ABS. IMM. GRANS.: 0 10*3/uL (ref 0.0–0.1)
ABS. MONOCYTES: 0.5 10*3/uL (ref 0.1–0.9)
ABS. NEUTROPHILS: 3.5 10*3/uL (ref 1.4–7.0)
Abs Lymphocytes: 1.8 10*3/uL (ref 0.7–3.1)
BASOPHILS: 1 %
EOSINOPHILS: 4 %
HCT: 51.6 % — ABNORMAL HIGH (ref 37.5–51.0)
HGB: 17.3 g/dL (ref 13.0–17.7)
IMMATURE GRANULOCYTES: 0 %
Lymphocytes: 29 %
MCH: 30.4 pg (ref 26.6–33.0)
MCHC: 33.5 g/dL (ref 31.5–35.7)
MCV: 91 fL (ref 79–97)
MONOCYTES: 8 %
NEUTROPHILS: 58 %
PLATELET: 245 10*3/uL (ref 150–450)
RBC: 5.69 x10E6/uL (ref 4.14–5.80)
RDW: 12.2 % (ref 11.6–15.4)
WBC: 6.2 10*3/uL (ref 3.4–10.8)

## 2018-08-26 LAB — PSA, DIAGNOSTIC (PROSTATE SPECIFIC AG): Prostate Specific Ag: 0.6 ng/mL (ref 0.0–4.0)

## 2018-08-26 LAB — SPECIMEN STATUS REPORT

## 2018-08-26 LAB — METABOLIC PANEL, COMPREHENSIVE
A-G Ratio: 1.9 (ref 1.2–2.2)
ALT (SGPT): 17 IU/L (ref 0–44)
AST (SGOT): 13 IU/L (ref 0–40)
Albumin: 4.5 g/dL (ref 3.8–4.9)
Alk. phosphatase: 82 IU/L (ref 39–117)
BUN/Creatinine ratio: 19 (ref 9–20)
BUN: 18 mg/dL (ref 6–24)
Bilirubin, total: 0.5 mg/dL (ref 0.0–1.2)
CO2: 22 mmol/L (ref 20–29)
Calcium: 9.4 mg/dL (ref 8.7–10.2)
Chloride: 101 mmol/L (ref 96–106)
Creatinine: 0.94 mg/dL (ref 0.76–1.27)
GFR est AA: 107 mL/min/{1.73_m2} (ref >59)
GFR est non-AA: 92 mL/min/{1.73_m2} (ref >59)
GLOBULIN, TOTAL: 2.4 g/dL (ref 1.5–4.5)
Glucose: 381 mg/dL — ABNORMAL HIGH (ref 65–99)
Potassium: 4.6 mmol/L (ref 3.5–5.2)
Protein, total: 6.9 g/dL (ref 6.0–8.5)
Sodium: 140 mmol/L (ref 134–144)

## 2018-08-26 LAB — HEMOGLOBIN A1C WITH EAG
Estimated average glucose: 280 mg/dL
Hemoglobin A1c: 11.4 % — ABNORMAL HIGH (ref 4.8–5.6)

## 2018-09-06 ENCOUNTER — Encounter: Attending: Internal Medicine | Primary: Internal Medicine

## 2018-09-24 ENCOUNTER — Encounter: Attending: Internal Medicine | Primary: Internal Medicine

## 2018-09-24 NOTE — Progress Notes (Deleted)
Samuel Eaton is a 53 y.o. male and presents with No chief complaint on file.  .  ***      Current Outpatient Medications   Medication Sig Dispense Refill   ??? SITagliptin-metFORMIN (Janumet) 50-1,000 mg per tablet Take 1 Tab by mouth two (2) times daily (with meals). 180 Tab 1     Not on File  Past Medical History:   Diagnosis Date   ??? Diabetes ([place])    ??? Neuropathy      No past surgical history on file.  Family History   Problem Relation Age of Onset   ??? Lung Cancer Mother         smoker   ??? Uterine Cancer Mother    ??? Melanoma Mother    ??? Breast Cancer Mother    ??? Other Father         histamine problem- angioedema   ??? Alcohol abuse Father    ??? Other Maternal Grandfather         meningioma     Social History     Tobacco Use   ??? Smoking status: Never Smoker   ??? Smokeless tobacco: Never Used   Substance Use Topics   ??? Alcohol use: Yes     Alcohol/week: 2.0 standard drinks     Types: 2 Glasses of wine per week     Frequency: Monthly or less      The patient {QM PQRS Fall Risk PQRS 154 & [id number]} a history of falls. A plan of care for falls {QM PQRS [facility] of Care PQRS [id number]}.  Depression screen positive, {Depression Follow-Up:[id number]}.      Objective:  There were no vitals taken for this visit.    In NAD. A&O.  HEENT -- Pupils round.  O/P Clear.  Neck -- Supple. No JVD.  Heart -- RRR. No R/M/G.  Lungs -- CTA.  Abdomen -- Soft. Non-tender. Non-distended. No masses. Bowel sounds present.  Extremeties -- No edema.        Assessment/Plan:  {No Diagnosis Found}        Author:  Genene Churn, MD 09/24/2018 12:58 PM

## 2018-10-07 ENCOUNTER — Ambulatory Visit: Attending: Internal Medicine | Primary: Internal Medicine

## 2018-10-07 ENCOUNTER — Ambulatory Visit: Admit: 2018-10-07 | Attending: Internal Medicine | Primary: Internal Medicine

## 2018-10-07 DIAGNOSIS — E118 Type 2 diabetes mellitus with unspecified complications: Secondary | ICD-10-CM

## 2018-10-07 DIAGNOSIS — IMO0002 Reserved for concepts with insufficient information to code with codable children: Secondary | ICD-10-CM

## 2018-10-07 LAB — AMB POC GLUCOSE BLOOD, BY GLUCOSE MONITORING DEVICE
Glucose POC: 273 mg/dL — AB (ref 65–99)
Glucose, POC: 273 mg/dL — AB (ref 65–99)

## 2018-10-07 MED ORDER — EMPAGLIFLOZIN 25 MG TABLET
25 mg | ORAL_TABLET | Freq: Every day | ORAL | 1 refills | Status: DC
Start: 2018-10-07 — End: 2019-01-10

## 2018-10-07 NOTE — Progress Notes (Signed)
Samuel Eaton is a 53 y.o. male and presents with   Chief Complaint   Patient presents with   ??? Follow-up   .  Patient restarted janumet. Not checking blood sugars.  Denies chest pain or sob      Current Outpatient Medications   Medication Sig Dispense Refill   ??? empagliflozin (Jardiance) 25 mg tablet Take 1 Tab by mouth daily. 30 Tab 1   ??? SITagliptin-metFORMIN (Janumet) 50-1,000 mg per tablet Take 1 Tab by mouth two (2) times daily (with meals). 180 Tab 1     Not on File  Past Medical History:   Diagnosis Date   ??? Diabetes ([place])    ??? Neuropathy      No past surgical history on file.  Family History   Problem Relation Age of Onset   ??? Lung Cancer Mother         smoker   ??? Uterine Cancer Mother    ??? Melanoma Mother    ??? Breast Cancer Mother    ??? Other Father         histamine problem- angioedema   ??? Alcohol abuse Father    ??? Other Maternal Grandfather         meningioma     Social History     Tobacco Use   ??? Smoking status: Never Smoker   ??? Smokeless tobacco: Never Used   Substance Use Topics   ??? Alcohol use: Yes     Alcohol/week: 2.0 standard drinks     Types: 2 Glasses of wine per week     Frequency: Monthly or less      The patient does not have a history of falls. A plan of care for falls was documented..  Depression screen neg      Objective:  Visit Vitals  BP 140/82   Temp 97.7 ??F (36.5 ??C)   Ht 6' 1.5" (1.867 m)   Wt 235 lb (106.6 kg)   BMI 30.58 kg/m??     wdwn 53 yo wm  In NAD. A&O.  HEENT -- Pupils round.  O/P Clear.  Neck -- Supple. No JVD.  Heart -- RRR. No R/M/G.  Lungs -- CTA.  Abdomen -- Soft. Non-tender. Non-distended. No masses. Bowel sounds present.  Extremities -- No edema.      Results for orders placed or performed in visit on 10/07/18   AMB POC GLUCOSE BLOOD, BY GLUCOSE MONITORING DEVICE   Result Value Ref Range    Glucose POC 273 (A) 65 - 99 mg/dL         Assessment/Plan:    ICD-10-CM ICD-9-CM    1. Type II diabetes mellitus with complication, uncontrolled (HCC)  E11.8 250.92 AMB POC GLUCOSE  BLOOD, BY GLUCOSE MONITORING DEVICE    E11.65  empagliflozin (Jardiance) 25 mg tablet   2. Need for pneumococcal vaccination  Z23 V03.82 PNEUMOCOCCAL POLYSACCHARIDE VACCINE, 23-VALENT, ADULT OR IMMUNOSUPPRESSED PT DOSE,     Start jardiance. Follow up one month.  Flu shot then      Author:  Genene Churn, MD 10/07/2018 2:38 PM

## 2018-10-07 NOTE — Progress Notes (Signed)
Samuel Eaton is a 53 y.o. male and presents with   Chief Complaint   Patient presents with   ??? Follow-up   .  Patient restarted janumet. Not checking blood sugars.  Denies chest pain or sob      Current Outpatient Medications   Medication Sig Dispense Refill   ??? empagliflozin (Jardiance) 25 mg tablet Take 1 Tab by mouth daily. 30 Tab 1   ??? SITagliptin-metFORMIN (Janumet) 50-1,000 mg per tablet Take 1 Tab by mouth two (2) times daily (with meals). 180 Tab 1     Not on File  Past Medical History:   Diagnosis Date   ??? Diabetes ([place])    ??? Neuropathy      No past surgical history on file.  Family History   Problem Relation Age of Onset   ??? Lung Cancer Mother         smoker   ??? Uterine Cancer Mother    ??? Melanoma Mother    ??? Breast Cancer Mother    ??? Other Father         histamine problem- angioedema   ??? Alcohol abuse Father    ??? Other Maternal Grandfather         meningioma     Social History     Tobacco Use   ??? Smoking status: Never Smoker   ??? Smokeless tobacco: Never Used   Substance Use Topics   ??? Alcohol use: Yes     Alcohol/week: 2.0 standard drinks     Types: 2 Glasses of wine per week     Frequency: Monthly or less      The patient does not have a history of falls. A plan of care for falls was documented..  Depression screen neg      Objective:  Visit Vitals  BP 140/82   Temp 97.7 ??F (36.5 ??C)   Ht 6' 1.5" (1.867 m)   Wt 235 lb (106.6 kg)   BMI 30.58 kg/m??     wdwn 53 yo wm  In NAD. A&O.  HEENT -- Pupils round.  O/P Clear.  Neck -- Supple. No JVD.  Heart -- RRR. No R/M/G.  Lungs -- CTA.  Abdomen -- Soft. Non-tender. Non-distended. No masses. Bowel sounds present.  Extremities -- No edema.      Results for orders placed or performed in visit on 10/07/18   AMB POC GLUCOSE BLOOD, BY GLUCOSE MONITORING DEVICE   Result Value Ref Range    Glucose POC 273 (A) 65 - 99 mg/dL         Assessment/Plan:    ICD-10-CM ICD-9-CM    1. Type II diabetes mellitus with complication, uncontrolled (HCC)  E11.8  250.92 AMB POC GLUCOSE BLOOD, BY GLUCOSE MONITORING DEVICE    E11.65  empagliflozin (Jardiance) 25 mg tablet   2. Need for pneumococcal vaccination  Z23 V03.82 PNEUMOCOCCAL POLYSACCHARIDE VACCINE, 23-VALENT, ADULT OR IMMUNOSUPPRESSED PT DOSE,     Start jardiance. Follow up one month.  Flu shot then      Author:  Genene Churn, MD 10/07/2018 2:38 PM

## 2018-11-11 ENCOUNTER — Ambulatory Visit: Attending: Internal Medicine | Primary: Internal Medicine

## 2018-11-11 ENCOUNTER — Ambulatory Visit: Admit: 2018-11-11 | Attending: Internal Medicine | Primary: Internal Medicine

## 2018-11-11 DIAGNOSIS — E1165 Type 2 diabetes mellitus with hyperglycemia: Secondary | ICD-10-CM

## 2018-11-11 LAB — AMB POC GLUCOSE BLOOD, BY GLUCOSE MONITORING DEVICE
Glucose POC: 379 mg/dL — AB (ref 65–99)
Glucose, POC: 379 mg/dL — AB (ref 65–99)

## 2018-11-11 NOTE — Progress Notes (Signed)
Samuel Eaton is a 53 y.o. male and presents with   Chief Complaint   Patient presents with   ??? Follow-up   .  STILL  Unable to get jardiance after two PA/  EVIDENTLY needs peer to peer review which I initiated.  Does not check blood sugars.  Asks about taking januvia instead of janumet  Not exercising much.  Denies chest pain or sbo    Current Outpatient Medications   Medication Sig Dispense Refill   ??? empagliflozin (Jardiance) 25 mg tablet Take 1 Tab by mouth daily. 30 Tab 1   ??? SITagliptin-metFORMIN (Janumet) 50-1,000 mg per tablet Take 1 Tab by mouth two (2) times daily (with meals). 180 Tab 1     Not on File  Past Medical History:   Diagnosis Date   ??? Diabetes ([place])    ??? Neuropathy      No past surgical history on file.  Family History   Problem Relation Age of Onset   ??? Lung Cancer Mother         smoker   ??? Uterine Cancer Mother    ??? Melanoma Mother    ??? Breast Cancer Mother    ??? Other Father         histamine problem- angioedema   ??? Alcohol abuse Father    ??? Other Maternal Grandfather         meningioma     Social History     Tobacco Use   ??? Smoking status: Never Smoker   ??? Smokeless tobacco: Never Used   Substance Use Topics   ??? Alcohol use: Yes     Alcohol/week: 2.0 standard drinks     Types: 2 Glasses of wine per week     Frequency: Monthly or less      The patient does not have a history of falls. A plan of care for falls was documented..  Depression screen neg      Objective:  Visit Vitals  BP 120/80   Temp 98 ??F (36.7 ??C)   Ht 6' 1.5" (1.867 m)   Wt 235 lb (106.6 kg)   BMI 30.58 kg/m??     wdwn 53 yo wm  In NAD. A&O.  HEENT -- Pupils round.  O/P Clear.  Neck -- Supple. No JVD.  Heart -- RRR. No R/M/G.  Lungs -- CTA.  Abdomen -- Soft. Non-tender. Non-distended. No masses. Bowel sounds present.  Extremities -- No edema.        Assessment/Plan:    ICD-10-CM ICD-9-CM    1. Uncontrolled type 2 diabetes mellitus with hyperglycemia (HCC)  E11.65 250.02    2. Need for influenza vaccination  Z23 V04.81 INFLUENZA  VIRUS VACCINE, PRESERVATIVE FREE [place], 3 YRS AND OLDER   3. Controlled type 2 diabetes mellitus with hyperglycemia, without long-term current use of insulin (HCC)  E11.65 250.80 AMB POC GLUCOSE BLOOD, BY GLUCOSE MONITORING DEVICE     790.29      Peer to peer for approval of jardiance      Author:  Genene Churn, MD 11/11/2018 12:40 PM

## 2018-11-11 NOTE — Progress Notes (Signed)
Samuel Eaton is a 53 y.o. male and presents with   Chief Complaint   Patient presents with   ??? Follow-up   .  STILL  Unable to get jardiance after two PA/  EVIDENTLY needs peer to peer review which I initiated.  Does not check blood sugars.  Asks about taking januvia instead of janumet  Not exercising much.  Denies chest pain or sbo    Current Outpatient Medications   Medication Sig Dispense Refill   ??? empagliflozin (Jardiance) 25 mg tablet Take 1 Tab by mouth daily. 30 Tab 1   ??? SITagliptin-metFORMIN (Janumet) 50-1,000 mg per tablet Take 1 Tab by mouth two (2) times daily (with meals). 180 Tab 1     Not on File  Past Medical History:   Diagnosis Date   ??? Diabetes ([place])    ??? Neuropathy      No past surgical history on file.  Family History   Problem Relation Age of Onset   ??? Lung Cancer Mother         smoker   ??? Uterine Cancer Mother    ??? Melanoma Mother    ??? Breast Cancer Mother    ??? Other Father         histamine problem- angioedema   ??? Alcohol abuse Father    ??? Other Maternal Grandfather         meningioma     Social History     Tobacco Use   ??? Smoking status: Never Smoker   ??? Smokeless tobacco: Never Used   Substance Use Topics   ??? Alcohol use: Yes     Alcohol/week: 2.0 standard drinks     Types: 2 Glasses of wine per week     Frequency: Monthly or less      The patient does not have a history of falls. A plan of care for falls was documented..  Depression screen neg      Objective:  Visit Vitals  BP 120/80   Temp 98 ??F (36.7 ??C)   Ht 6' 1.5" (1.867 m)   Wt 235 lb (106.6 kg)   BMI 30.58 kg/m??     wdwn 53 yo wm  In NAD. A&O.  HEENT -- Pupils round.  O/P Clear.  Neck -- Supple. No JVD.  Heart -- RRR. No R/M/G.  Lungs -- CTA.  Abdomen -- Soft. Non-tender. Non-distended. No masses. Bowel sounds present.  Extremities -- No edema.        Assessment/Plan:    ICD-10-CM ICD-9-CM    1. Uncontrolled type 2 diabetes mellitus with hyperglycemia (HCC)  E11.65 250.02     2. Need for influenza vaccination  Z23 V04.81 INFLUENZA VIRUS VACCINE, PRESERVATIVE FREE SYRINGE, 3 YRS AND OLDER   3. Controlled type 2 diabetes mellitus with hyperglycemia, without long-term current use of insulin (HCC)  E11.65 250.80 AMB POC GLUCOSE BLOOD, BY GLUCOSE MONITORING DEVICE     790.29      Peer to peer for approval of jardiance      Author:  Genene Churn, MD 11/11/2018 12:40 PM

## 2018-12-08 ENCOUNTER — Encounter: Attending: Internal Medicine | Primary: Internal Medicine

## 2018-12-14 ENCOUNTER — Ambulatory Visit: Attending: Internal Medicine | Primary: Internal Medicine

## 2018-12-14 ENCOUNTER — Ambulatory Visit: Admit: 2018-12-14 | Attending: Internal Medicine | Primary: Internal Medicine

## 2018-12-14 DIAGNOSIS — E13319 Other specified diabetes mellitus with unspecified diabetic retinopathy without macular edema: Secondary | ICD-10-CM

## 2018-12-14 NOTE — Progress Notes (Signed)
Called patient

## 2018-12-14 NOTE — Progress Notes (Signed)
Will call me back. Patient has to pick up children.

## 2018-12-14 NOTE — Progress Notes (Signed)
pls call- aic not much improved.  Come in tomorrow to start insulin until we get blood sugars better controlled

## 2018-12-14 NOTE — Progress Notes (Signed)
Samuel Eaton is a 53 y.o. male and presents with   Chief Complaint   Patient presents with   ??? Follow-up   .  Patient here for diabetes followup. Finally got jardiance approved. Not checking blood sugars  aic 11.4 in July.  Asks about getting a covid test- will be visiting grandmother and wants to make sure he is not contagious      Current Outpatient Medications   Medication Sig Dispense Refill   ??? empagliflozin (Jardiance) 25 mg tablet Take 1 Tab by mouth daily. 30 Tab 1   ??? SITagliptin-metFORMIN (Janumet) 50-1,000 mg per tablet Take 1 Tab by mouth two (2) times daily (with meals). 180 Tab 1     Not on File  Past Medical History:   Diagnosis Date   ??? Diabetes ([place])    ??? Neuropathy      No past surgical history on file.  Family History   Problem Relation Age of Onset   ??? Lung Cancer Mother         smoker   ??? Uterine Cancer Mother    ??? Melanoma Mother    ??? Breast Cancer Mother    ??? Other Father         histamine problem- angioedema   ??? Alcohol abuse Father    ??? Other Maternal Grandfather         meningioma     Social History     Tobacco Use   ??? Smoking status: Never Smoker   ??? Smokeless tobacco: Never Used   Substance Use Topics   ??? Alcohol use: Yes     Alcohol/week: 2.0 standard drinks     Types: 2 Glasses of wine per week     Frequency: Monthly or less      The patient does not have a history of falls. A plan of care for falls was documented..  Depression screen neg      Objective:  Visit Vitals  BP 130/80   Temp 97.3 ??F (36.3 ??C)   Ht 6' 1.5" (1.867 m)   Wt 241 lb (109.3 kg)   BMI 31.36 kg/m??     wdwn 53  Yo wm  In NAD. A&O.  HEENT -- Pupils round.  O/P Clear.  Neck -- Supple. No JVD.  Heart -- RRR. No R/M/G.  Lungs -- CTA.  Abdomen -- Soft. Non-tender. Non-distended. No masses. Bowel sounds present.  Extremities -- No edema.        Assessment/Plan:    ICD-10-CM ICD-9-CM    1. 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

## 2018-12-14 NOTE — Progress Notes (Signed)
pls call- covid test negative.

## 2018-12-14 NOTE — Progress Notes (Signed)
pls call- aic not much improved.  Come in tomorrow to start insulin until we get blood sugars better controlled

## 2018-12-14 NOTE — Progress Notes (Signed)
Will call me back. Patient has to pick up children.

## 2018-12-14 NOTE — Progress Notes (Signed)
Samuel Eaton is a 53 y.o. male and presents with   Chief Complaint   Patient presents with   ??? Follow-up   .  Patient here for diabetes followup. Finally got jardiance approved. Not checking blood sugars  aic 11.4 in July.  Asks about getting a covid test- will be visiting grandmother and wants to make sure he is not contagious      Current Outpatient Medications   Medication Sig Dispense Refill   ??? empagliflozin (Jardiance) 25 mg tablet Take 1 Tab by mouth daily. 30 Tab 1   ??? SITagliptin-metFORMIN (Janumet) 50-1,000 mg per tablet Take 1 Tab by mouth two (2) times daily (with meals). 180 Tab 1     Not on File  Past Medical History:   Diagnosis Date   ??? Diabetes (HCC)    ??? Neuropathy      No past surgical history on file.  Family History   Problem Relation Age of Onset   ??? Lung Cancer Mother         smoker   ??? Uterine Cancer Mother    ??? Melanoma Mother    ??? Breast Cancer Mother    ??? Other Father         histamine problem- angioedema   ??? Alcohol abuse Father    ??? Other Maternal Grandfather         meningioma     Social History     Tobacco Use   ??? Smoking status: Never Smoker   ??? Smokeless tobacco: Never Used   Substance Use Topics   ??? Alcohol use: Yes     Alcohol/week: 2.0 standard drinks     Types: 2 Glasses of wine per week     Frequency: Monthly or less      The patient does not have a history of falls. A plan of care for falls was documented..  Depression screen neg      Objective:  Visit Vitals  BP 130/80   Temp 97.3 ??F (36.3 ??C)   Ht 6' 1.5" (1.867 m)   Wt 241 lb (109.3 kg)   BMI 31.36 kg/m??     wdwn 53  Yo wm  In NAD. A&O.  HEENT -- Pupils round.  O/P Clear.  Neck -- Supple. No JVD.  Heart -- RRR. No R/M/G.  Lungs -- CTA.  Abdomen -- Soft. Non-tender. Non-distended. No masses. Bowel sounds present.  Extremities -- No edema.        Assessment/Plan:    ICD-10-CM ICD-9-CM    1. Retinopathy due to secondary diabetes (HCC)  E13.319 249.50 COLLECTION VENOUS BLOOD,VENIPUNCTURE     362.01 HEMOGLOBIN A1C WITH EAG       NOVEL CORONAVIRUS (COVID-19)           Author:  Graig Hessling J Goble Fudala, MD 12/14/2018 3:52 PM

## 2018-12-14 NOTE — Progress Notes (Signed)
pls call- covid test negative.

## 2018-12-15 LAB — HEMOGLOBIN A1C W/EAG
Hemoglobin A1C: 11.2 % — ABNORMAL HIGH (ref 4.8–5.6)
eAG: 275 mg/dL

## 2018-12-15 LAB — HEMOGLOBIN A1C WITH EAG
Estimated average glucose: 275 mg/dL
Hemoglobin A1c: 11.2 % — ABNORMAL HIGH (ref 4.8–5.6)

## 2018-12-16 NOTE — Telephone Encounter (Signed)
He needs to follow up this afternoon or next week

## 2018-12-16 NOTE — Telephone Encounter (Signed)
Then come in before he leaves or when he comes back.

## 2018-12-17 LAB — COVID-19: SARS-CoV-2, NAA: NOT DETECTED

## 2018-12-17 LAB — NOVEL CORONAVIRUS (COVID-19): SARS-CoV-2, NAA: NOT DETECTED

## 2018-12-27 ENCOUNTER — Encounter: Attending: Internal Medicine | Primary: Internal Medicine

## 2018-12-29 ENCOUNTER — Ambulatory Visit: Attending: Internal Medicine | Primary: Internal Medicine

## 2018-12-29 ENCOUNTER — Ambulatory Visit: Admit: 2018-12-29 | Attending: Internal Medicine | Primary: Internal Medicine

## 2018-12-29 DIAGNOSIS — E1165 Type 2 diabetes mellitus with hyperglycemia: Secondary | ICD-10-CM

## 2018-12-29 NOTE — Progress Notes (Signed)
Samuel Eaton is a 53 y.o. male and presents with   Chief Complaint   Patient presents with   ??? Follow-up   .  aic 11/17  11.2.  Does not want to start insulin  Will be moving to [place] this weekend.      Current Outpatient Medications   Medication Sig Dispense Refill   ??? empagliflozin (Jardiance) 25 mg tablet Take 1 Tab by mouth daily. 30 Tab 1   ??? SITagliptin-metFORMIN (Janumet) 50-1,000 mg per tablet Take 1 Tab by mouth two (2) times daily (with meals). 180 Tab 1     Not on File  Past Medical History:   Diagnosis Date   ??? Diabetes ([place])    ??? Neuropathy      No past surgical history on file.  Family History   Problem Relation Age of Onset   ??? Lung Cancer Mother         smoker   ??? Uterine Cancer Mother    ??? Melanoma Mother    ??? Breast Cancer Mother    ??? Other Father         histamine problem- angioedema   ??? Alcohol abuse Father    ??? Other Maternal Grandfather         meningioma     Social History     Tobacco Use   ??? Smoking status: Never Smoker   ??? Smokeless tobacco: Never Used   Substance Use Topics   ??? Alcohol use: Yes     Alcohol/week: 2.0 standard drinks     Types: 2 Glasses of wine per week     Frequency: Monthly or less      The patient does not have a history of falls. A plan of care for falls was documented..  Depression screen neg.      Objective:  Visit Vitals  BP 120/86   Temp 97.3 ??F (36.3 ??C)   Ht 6' 1.5" (1.867 m)   Wt 237 lb (107.5 kg)   BMI 30.84 kg/m??     Wdwn 53 yo wm  In NAD. A&O.  HEENT -- Pupils round.  O/P Clear.  Neck -- Supple. No JVD.  Heart -- RRR. No R/M/G.  Lungs -- CTA.  Abdomen -- Soft. Non-tender. Non-distended. No masses. Bowel sounds present.  Extremities -- No edema.        Assessment/Plan:    ICD-10-CM ICD-9-CM    1. Uncontrolled type 2 diabetes mellitus with hyperglycemia (HCC)  E11.65 250.02    2. 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  Declines starting insulin now\\ continue other meds      Author:  Genene Churn, MD  12/29/2018 7:28 PM

## 2018-12-29 NOTE — Progress Notes (Signed)
Samuel Eaton is a 53 y.o. male and presents with   Chief Complaint   Patient presents with   ??? Follow-up   .  aic 11/17  11.2.  Does not want to start insulin  Will be moving to [place] this weekend.      Current Outpatient Medications   Medication Sig Dispense Refill   ??? empagliflozin (Jardiance) 25 mg tablet Take 1 Tab by mouth daily. 30 Tab 1   ??? SITagliptin-metFORMIN (Janumet) 50-1,000 mg per tablet Take 1 Tab by mouth two (2) times daily (with meals). 180 Tab 1     Not on File  Past Medical History:   Diagnosis Date   ??? Diabetes ([place])    ??? Neuropathy      No past surgical history on file.  Family History   Problem Relation Age of Onset   ??? Lung Cancer Mother         smoker   ??? Uterine Cancer Mother    ??? Melanoma Mother    ??? Breast Cancer Mother    ??? Other Father         histamine problem- angioedema   ??? Alcohol abuse Father    ??? Other Maternal Grandfather         meningioma     Social History     Tobacco Use   ??? Smoking status: Never Smoker   ??? Smokeless tobacco: Never Used   Substance Use Topics   ??? Alcohol use: Yes     Alcohol/week: 2.0 standard drinks     Types: 2 Glasses of wine per week     Frequency: Monthly or less      The patient does not have a history of falls. A plan of care for falls was documented..  Depression screen neg.      Objective:  Visit Vitals  BP 120/86   Temp 97.3 ??F (36.3 ??C)   Ht 6' 1.5" (1.867 m)   Wt 237 lb (107.5 kg)   BMI 30.84 kg/m??     Wdwn 53 yo wm  In NAD. A&O.  HEENT -- Pupils round.  O/P Clear.  Neck -- Supple. No JVD.  Heart -- RRR. No R/M/G.  Lungs -- CTA.  Abdomen -- Soft. Non-tender. Non-distended. No masses. Bowel sounds present.  Extremities -- No edema.        Assessment/Plan:    ICD-10-CM ICD-9-CM    1. Uncontrolled type 2 diabetes mellitus with hyperglycemia (HCC)  E11.65 250.02    2. 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  Declines starting insulin now\\ continue other meds       Author:  Genene Churn, MD 12/29/2018 7:28 PM

## 2019-01-10 ENCOUNTER — Encounter

## 2019-01-10 MED ORDER — EMPAGLIFLOZIN 25 MG TABLET
25 mg | ORAL_TABLET | Freq: Every day | ORAL | 1 refills | Status: AC
Start: 2019-01-10 — End: ?

## 2019-01-11 ENCOUNTER — Encounter: Attending: Internal Medicine | Primary: Internal Medicine

## 2019-01-11 NOTE — Telephone Encounter (Signed)
Ok you can call in one month supply.  He will need to find a new doctor then

## 2019-04-20 ENCOUNTER — Encounter

## 2021-08-01 NOTE — Progress Notes (Deleted)
   There were no vitals taken for this visit.   Subjective:    Patient ID: Jack Owens, male    DOB: [birth year], 56 y.o.   MRN: [id number]  HPI: Jack Owens is a 56 y.o. male  No chief complaint on file.  Patient presents to clinic to establish care with new PCP.  Introduced to Publishing rights manager role and practice setting.  All questions answered.  Discussed provider/patient relationship and expectations.  Patient reports a history of ***. Patient denies a history of: Hypertension, Elevated Cholesterol, Diabetes, Thyroid problems, Depression, Anxiety, Neurological problems, and Abdominal problems.   Active Ambulatory Problems    Diagnosis Date Noted   Redundant skin 08/03/2012   Resolved Ambulatory Problems    Diagnosis Date Noted   No Resolved Ambulatory Problems   Past Medical History:  Diagnosis Date   Depression    Diabetes mellitus without complication (HCC)    Hyperlipidemia    Hypertension    Past Surgical History:  Procedure Laterality Date   TONSILECTOMY/ADENOIDECTOMY WITH MYRINGOTOMY  as child   No family history on file.   Review of Systems  Per HPI unless specifically indicated above     Objective:    There were no vitals taken for this visit.  Wt Readings from Last 3 Encounters:  08/02/12 (!) 369 lb (167.4 kg)    Physical Exam  No results found for this or any previous visit.    Assessment & Plan:   Problem List Items Addressed This Visit   None Visit Diagnoses     Encounter to establish care    -  Primary        Follow up plan: No follow-ups on file.

## 2021-08-02 ENCOUNTER — Ambulatory Visit: Payer: Self-pay | Admitting: Nurse Practitioner

## 2021-08-02 DIAGNOSIS — Z7689 Persons encountering health services in other specified circumstances: Secondary | ICD-10-CM

## 2024-01-24 ENCOUNTER — Other Ambulatory Visit: Payer: Self-pay

## 2024-01-24 ENCOUNTER — Inpatient Hospital Stay

## 2024-01-24 ENCOUNTER — Encounter: Payer: Self-pay | Admitting: Emergency Medicine

## 2024-01-24 ENCOUNTER — Inpatient Hospital Stay
Admission: EM | Admit: 2024-01-24 | Discharge: 2024-02-02 | DRG: 637 | Disposition: A | Discharge status: Home / Self Care | Attending: Internal Medicine | Admitting: Internal Medicine

## 2024-01-24 DIAGNOSIS — R601 Generalized edema: Secondary | ICD-10-CM | POA: Diagnosis present

## 2024-01-24 DIAGNOSIS — E66813 Obesity, class 3: Secondary | ICD-10-CM | POA: Diagnosis present

## 2024-01-24 DIAGNOSIS — E785 Hyperlipidemia, unspecified: Secondary | ICD-10-CM | POA: Diagnosis present

## 2024-01-24 DIAGNOSIS — F419 Anxiety disorder, unspecified: Secondary | ICD-10-CM | POA: Diagnosis present

## 2024-01-24 DIAGNOSIS — E11628 Type 2 diabetes mellitus with other skin complications: Principal | ICD-10-CM | POA: Diagnosis present

## 2024-01-24 DIAGNOSIS — E872 Acidosis, unspecified: Secondary | ICD-10-CM | POA: Diagnosis present

## 2024-01-24 DIAGNOSIS — Z7984 Long term (current) use of oral hypoglycemic drugs: Secondary | ICD-10-CM

## 2024-01-24 DIAGNOSIS — Z5902 Unsheltered homelessness: Secondary | ICD-10-CM

## 2024-01-24 DIAGNOSIS — M79605 Pain in left leg: Secondary | ICD-10-CM | POA: Diagnosis present

## 2024-01-24 DIAGNOSIS — I959 Hypotension, unspecified: Secondary | ICD-10-CM | POA: Diagnosis not present

## 2024-01-24 DIAGNOSIS — L89893 Pressure ulcer of other site, stage 3: Secondary | ICD-10-CM | POA: Diagnosis present

## 2024-01-24 DIAGNOSIS — Z6841 Body Mass Index (BMI) 40.0 and over, adult: Secondary | ICD-10-CM

## 2024-01-24 DIAGNOSIS — M1712 Unilateral primary osteoarthritis, left knee: Secondary | ICD-10-CM | POA: Diagnosis present

## 2024-01-24 DIAGNOSIS — R7989 Other specified abnormal findings of blood chemistry: Secondary | ICD-10-CM | POA: Diagnosis not present

## 2024-01-24 DIAGNOSIS — I890 Lymphedema, not elsewhere classified: Secondary | ICD-10-CM | POA: Diagnosis present

## 2024-01-24 DIAGNOSIS — L8990 Pressure ulcer of unspecified site, unspecified stage: Secondary | ICD-10-CM | POA: Insufficient documentation

## 2024-01-24 DIAGNOSIS — F3289 Other specified depressive episodes: Secondary | ICD-10-CM | POA: Diagnosis not present

## 2024-01-24 DIAGNOSIS — F32A Depression, unspecified: Secondary | ICD-10-CM | POA: Diagnosis present

## 2024-01-24 DIAGNOSIS — I10 Essential (primary) hypertension: Secondary | ICD-10-CM | POA: Diagnosis present

## 2024-01-24 DIAGNOSIS — Z5971 Insufficient health insurance coverage: Secondary | ICD-10-CM | POA: Diagnosis not present

## 2024-01-24 DIAGNOSIS — L03116 Cellulitis of left lower limb: Secondary | ICD-10-CM | POA: Diagnosis present

## 2024-01-24 DIAGNOSIS — Z79899 Other long term (current) drug therapy: Secondary | ICD-10-CM

## 2024-01-24 DIAGNOSIS — E119 Type 2 diabetes mellitus without complications: Secondary | ICD-10-CM | POA: Diagnosis present

## 2024-01-24 DIAGNOSIS — L03311 Cellulitis of abdominal wall: Secondary | ICD-10-CM | POA: Diagnosis present

## 2024-01-24 DIAGNOSIS — E7849 Other hyperlipidemia: Secondary | ICD-10-CM | POA: Diagnosis not present

## 2024-01-24 DIAGNOSIS — L0390 Cellulitis, unspecified: Principal | ICD-10-CM

## 2024-01-24 DIAGNOSIS — I9589 Other hypotension: Secondary | ICD-10-CM | POA: Diagnosis not present

## 2024-01-24 LAB — CBC WITH DIFFERENTIAL/PLATELET
Abs Immature Granulocytes: 0.04 K/uL (ref 0.00–0.07)
Basophils Absolute: 0.1 K/uL (ref 0.0–0.1)
Basophils Relative: 1 %
Eosinophils Absolute: 0.3 K/uL (ref 0.0–0.5)
Eosinophils Relative: 3 %
HCT: 38.6 % — ABNORMAL LOW (ref 39.0–52.0)
Hemoglobin: 12 g/dL — ABNORMAL LOW (ref 13.0–17.0)
Immature Granulocytes: 0 %
Lymphocytes Relative: 16 %
Lymphs Abs: 1.5 K/uL (ref 0.7–4.0)
MCH: 28 pg (ref 26.0–34.0)
MCHC: 31.1 g/dL (ref 30.0–36.0)
MCV: 90.2 fL (ref 80.0–100.0)
Monocytes Absolute: 0.8 K/uL (ref 0.1–1.0)
Monocytes Relative: 8 %
Neutro Abs: 7.2 K/uL (ref 1.7–7.7)
Neutrophils Relative %: 72 %
Platelets: 438 K/uL — ABNORMAL HIGH (ref 150–400)
RBC: 4.28 MIL/uL (ref 4.22–5.81)
RDW: 14.3 % (ref 11.5–15.5)
WBC: 10 K/uL (ref 4.0–10.5)
nRBC: 0 % (ref 0.0–0.2)

## 2024-01-24 LAB — COMPREHENSIVE METABOLIC PANEL WITH GFR
ALT: 9 U/L (ref 0–44)
AST: 16 U/L (ref 15–41)
Albumin: 3.9 g/dL (ref 3.5–5.0)
Alkaline Phosphatase: 84 U/L (ref 38–126)
Anion gap: 15 (ref 5–15)
BUN: 14 mg/dL (ref 6–20)
CO2: 25 mmol/L (ref 22–32)
Calcium: 9 mg/dL (ref 8.9–10.3)
Chloride: 99 mmol/L (ref 98–111)
Creatinine, Ser: 0.73 mg/dL (ref 0.61–1.24)
GFR, Estimated: >60 mL/min
Glucose, Bld: 169 mg/dL — ABNORMAL HIGH (ref 70–99)
Potassium: 3.8 mmol/L (ref 3.5–5.1)
Sodium: 139 mmol/L (ref 135–145)
Total Bilirubin: 0.5 mg/dL (ref 0.0–1.2)
Total Protein: 7.7 g/dL (ref 6.5–8.1)

## 2024-01-24 LAB — HIV ANTIBODY (ROUTINE TESTING W REFLEX): HIV Screen 4th Generation wRfx: NONREACTIVE

## 2024-01-24 LAB — GLUCOSE, CAPILLARY
Glucose-Capillary: 113 mg/dL — ABNORMAL HIGH (ref 70–99)
Glucose-Capillary: 104 mg/dL — ABNORMAL HIGH (ref 70–99)
Glucose-Capillary: 159 mg/dL — ABNORMAL HIGH (ref 70–99)

## 2024-01-24 LAB — HEMOGLOBIN A1C
Hgb A1c MFr Bld: 6.6 % — ABNORMAL HIGH (ref 4.8–5.6)
Mean Plasma Glucose: 142.72 mg/dL

## 2024-01-24 LAB — CK: Total CK: 29 U/L — ABNORMAL LOW (ref 49–397)

## 2024-01-24 LAB — LACTIC ACID, PLASMA
Lactic Acid, Venous: 2.5 mmol/L (ref 0.5–1.9)
Lactic Acid, Venous: 2.5 mmol/L (ref 0.5–1.9)

## 2024-01-24 MED ORDER — SODIUM CHLORIDE 0.9 % IV SOLN: 2.0000 g | Freq: Once | INTRAVENOUS | Status: DC

## 2024-01-24 MED ORDER — LISINOPRIL 20 MG PO TABS
20.0000 mg | ORAL_TABLET | Freq: Every day | ORAL | Status: DC
  Administered 2024-01-24 – 2024-01-26 (×3): 20 mg via ORAL
  Filled 2024-01-24: qty 1

## 2024-01-24 MED ORDER — VANCOMYCIN HCL IN DEXTROSE 1-5 GM/200ML-% IV SOLN: 1000.0000 mg | Freq: Once | INTRAVENOUS | Status: DC

## 2024-01-24 MED ORDER — ACETAMINOPHEN 325 MG PO TABS
650.0000 mg | ORAL_TABLET | Freq: Four times a day (QID) | ORAL | Status: DC | PRN
  Administered 2024-01-25 – 2024-02-01 (×9): 650 mg via ORAL
  Filled 2024-01-24 (×2): qty 2

## 2024-01-24 MED ORDER — MAGNESIUM HYDROXIDE 400 MG/5ML PO SUSP: 30.0000 mL | Freq: Every day | ORAL | Status: DC | PRN

## 2024-01-24 MED ORDER — FUROSEMIDE 10 MG/ML IJ SOLN
40.0000 mg | Freq: Two times a day (BID) | INTRAMUSCULAR | Status: DC
  Administered 2024-01-24 – 2024-01-26 (×5): 40 mg via INTRAVENOUS
  Filled 2024-01-24 (×2): qty 4

## 2024-01-24 MED ORDER — TRAZODONE HCL 50 MG PO TABS
25.0000 mg | ORAL_TABLET | Freq: Every evening | ORAL | Status: DC | PRN
  Administered 2024-01-25 – 2024-02-01 (×8): 25 mg via ORAL
  Filled 2024-01-24: qty 1

## 2024-01-24 MED ORDER — BUPROPION HCL ER (XL) 150 MG PO TB24
150.0000 mg | ORAL_TABLET | Freq: Every day | ORAL | Status: DC
  Administered 2024-01-24 – 2024-02-02 (×10): 150 mg via ORAL
  Filled 2024-01-24 (×3): qty 1

## 2024-01-24 MED ORDER — CITALOPRAM HYDROBROMIDE 10 MG PO TABS
40.0000 mg | ORAL_TABLET | Freq: Every day | ORAL | Status: DC
  Administered 2024-01-24 – 2024-02-02 (×10): 40 mg via ORAL
  Filled 2024-01-24 (×3): qty 4

## 2024-01-24 MED ORDER — ONDANSETRON HCL 4 MG/2ML IJ SOLN: 4.0000 mg | Freq: Four times a day (QID) | INTRAMUSCULAR | Status: DC | PRN

## 2024-01-24 MED ORDER — ONDANSETRON HCL 4 MG PO TABS: 4.0000 mg | ORAL_TABLET | Freq: Four times a day (QID) | ORAL | Status: DC | PRN

## 2024-01-24 MED ORDER — INSULIN ASPART 100 UNIT/ML IJ SOLN
0.0000 [IU] | Freq: Three times a day (TID) | INTRAMUSCULAR | Status: DC
  Administered 2024-01-25: 4 [IU] via SUBCUTANEOUS
  Administered 2024-01-25: 3 [IU] via SUBCUTANEOUS
  Administered 2024-01-25: 4 [IU] via SUBCUTANEOUS
  Administered 2024-01-26 – 2024-01-28 (×7): 3 [IU] via SUBCUTANEOUS
  Administered 2024-01-28 (×2): 4 [IU] via SUBCUTANEOUS
  Administered 2024-01-29 – 2024-01-30 (×4): 3 [IU] via SUBCUTANEOUS
  Administered 2024-01-30: 4 [IU] via SUBCUTANEOUS
  Administered 2024-01-30 – 2024-01-31 (×2): 3 [IU] via SUBCUTANEOUS
  Administered 2024-01-31 (×2): 4 [IU] via SUBCUTANEOUS
  Administered 2024-02-01 – 2024-02-02 (×4): 3 [IU] via SUBCUTANEOUS
  Filled 2024-01-24 (×4): qty 3

## 2024-01-24 MED ORDER — SODIUM CHLORIDE 0.9 % IV SOLN: INTRAVENOUS | Status: AC

## 2024-01-24 MED ORDER — VANCOMYCIN HCL 2000 MG/400ML IV SOLN
2000.0000 mg | Freq: Two times a day (BID) | INTRAVENOUS | Status: DC
  Administered 2024-01-24 – 2024-01-26 (×4): 2000 mg via INTRAVENOUS
  Filled 2024-01-24 (×2): qty 400

## 2024-01-24 MED ORDER — VANCOMYCIN HCL 2000 MG/400ML IV SOLN
2000.0000 mg | Freq: Once | INTRAVENOUS | Status: AC
  Administered 2024-01-24: 2000 mg via INTRAVENOUS
  Filled 2024-01-24: qty 400

## 2024-01-24 MED ORDER — ENOXAPARIN SODIUM 120 MG/0.8ML IJ SOSY
110.0000 mg | PREFILLED_SYRINGE | INTRAMUSCULAR | Status: DC
  Administered 2024-01-24 – 2024-02-02 (×10): 110 mg via SUBCUTANEOUS
  Filled 2024-01-24 (×3): qty 0.74

## 2024-01-24 MED ORDER — GERHARDT'S BUTT CREAM
TOPICAL_CREAM | Freq: Two times a day (BID) | CUTANEOUS | Status: DC
  Administered 2024-01-27 – 2024-02-02 (×4): 1 via TOPICAL
  Filled 2024-01-24: qty 60

## 2024-01-24 MED ORDER — INSULIN ASPART 100 UNIT/ML IJ SOLN: 0.0000 [IU] | Freq: Every day | INTRAMUSCULAR | Status: DC

## 2024-01-24 MED ORDER — SODIUM CHLORIDE 0.9 % IV SOLN
1.0000 g | Freq: Once | INTRAVENOUS | Status: AC
  Administered 2024-01-24: 1 g via INTRAVENOUS
  Filled 2024-01-24: qty 10

## 2024-01-24 MED ORDER — PRAVASTATIN SODIUM 20 MG PO TABS
40.0000 mg | ORAL_TABLET | Freq: Every day | ORAL | Status: DC
  Administered 2024-01-24 – 2024-02-01 (×9): 40 mg via ORAL
  Filled 2024-01-24 (×2): qty 2

## 2024-01-24 MED ORDER — IOHEXOL 300 MG/ML  SOLN
100.0000 mL | Freq: Once | INTRAMUSCULAR | Status: AC | PRN
  Administered 2024-01-24: 100 mL via INTRAVENOUS

## 2024-01-24 MED ORDER — ACETAMINOPHEN 650 MG RE SUPP: 650.0000 mg | Freq: Four times a day (QID) | RECTAL | Status: DC | PRN

## 2024-01-24 MED ORDER — SODIUM CHLORIDE 0.9 % IV SOLN
2.0000 g | Freq: Three times a day (TID) | INTRAVENOUS | Status: DC
  Administered 2024-01-24 – 2024-01-26 (×7): 2 g via INTRAVENOUS
  Filled 2024-01-24 (×3): qty 12.5

## 2024-01-24 MED ORDER — FESOTERODINE FUMARATE ER 4 MG PO TB24
4.0000 mg | ORAL_TABLET | Freq: Every day | ORAL | Status: DC
  Administered 2024-01-24 – 2024-02-02 (×10): 4 mg via ORAL
  Filled 2024-01-24 (×3): qty 1

## 2024-01-24 NOTE — Plan of Care (Signed)

## 2024-01-24 NOTE — H&P (Addendum)
 " History and Physical    Jack Owens FMW:[id number] DOB: 03-15-1965 DOA: 01/24/2024  PCP: Trudy Herring, MD (Confirm with patient/family/NH records and if not entered, this has to be entered at [facility] point of entry) Patient coming from: Home  I have personally briefly reviewed patient's old medical records in [facility]  Chief Complaint: Leg and bottom pain and swelling  HPI: Jack Owens is a 58 y.o. male with medical history significant of morbid obesity, IIDM, HLD, HTN, chronic lymphedema, presented with worsening of left leg rash swelling pain.  Patient lives in a mobile home lives, lifestyles very much sedentary due to morbid obesity.  He has a chronic lymphedema and 3 days ago he started to feel swelling pain and rash of the backside of the thigh and pannus.  Denied any fever or chills no trouble urinating or moving his bowels.  ED Course: Temperature 98.6 blood pressure 130/60 O2 saturation 99% on room air.  Blood work showed WBC 10.0 lactic acid 2.5> 2.5.  BUN 14 creatinine 0.7 bicarb 25.  Patient was given vancomycin  and cefepime  for severe cellulitis in the ED.  Review of Systems: As per HPI otherwise 14 point review of systems negative.    Past Medical History:  Diagnosis Date   Depression    Diabetes mellitus without complication (HCC)    Hyperlipidemia    Hypertension     Past Surgical History:  Procedure Laterality Date   TONSILECTOMY/ADENOIDECTOMY WITH MYRINGOTOMY  as child     reports that he has never smoked. He has never used smokeless tobacco. He reports that he does not drink alcohol and does not use drugs.  Allergies[1]  History reviewed. No pertinent family history.   Prior to Admission medications  Medication Sig Start Date End Date Taking? Authorizing Provider  buPROPion  (WELLBUTRIN  XL) 150 MG 24 hr tablet Take 150 mg by mouth daily. 12/09/23  Yes [provider]  citalopram  (CELEXA ) 40 MG tablet Take 40 mg by mouth daily. 12/09/23   Yes [provider]  lisinopril  (ZESTRIL ) 20 MG tablet Take 20 mg by mouth daily. 12/09/23  Yes [provider]  lovastatin (MEVACOR) 40 MG tablet Take 40 mg by mouth at bedtime.   Yes [provider]  solifenacin (VESICARE) 10 MG tablet Take 10 mg by mouth daily. 12/09/23  Yes [provider]  hydrochlorothiazide (HYDRODIURIL) 25 MG tablet Take 25 mg by mouth daily. Patient not taking: Reported on 01/24/2024    [provider]  metFORMIN (GLUCOPHAGE) 500 MG tablet Take 500 mg by mouth 2 (two) times daily with a meal. Patient not taking: Reported on 01/24/2024    [provider]    Physical Exam: Vitals:   01/24/24 0140 01/24/24 0356 01/24/24 0435 01/24/24 0810  BP: (!) 169/80 135/70 (!) 169/74 (!) 133/58  Pulse: 88 98 77 87  Resp: 18 20 20 18   Temp: 98.6 F (37 C)  98.4 F (36.9 C) 98.7 F (37.1 C)  TempSrc: Oral Oral    SpO2: 95% 98%  99%  Weight:      Height:        Constitutional: NAD, calm, comfortable Vitals:   01/24/24 0140 01/24/24 0356 01/24/24 0435 01/24/24 0810  BP: (!) 169/80 135/70 (!) 169/74 (!) 133/58  Pulse: 88 98 77 87  Resp: 18 20 20 18   Temp: 98.6 F (37 C)  98.4 F (36.9 C) 98.7 F (37.1 C)  TempSrc: Oral Oral    SpO2: 95% 98%  99%  Weight:      Height:       Eyes: PERRL, lids and conjunctivae normal ENMT: Mucous membranes are moist. Posterior pharynx clear of any exudate or lesions.Normal dentition.  Neck: normal, supple, no masses, no thyromegaly Respiratory: clear to auscultation bilaterally, no wheezing, no crackles. Normal respiratory effort. No accessory muscle use.  Cardiovascular: Regular rate and rhythm, no murmurs / rubs / gallops. No extremity edema. 2+ pedal pulses. No carotid bruits.  Abdomen: no tenderness, no masses palpated. No hepatosplenomegaly. Bowel sounds positive.  Musculoskeletal: no clubbing / cyanosis. No joint deformity upper and lower extremities. Good ROM, no  contractures. Normal muscle tone.  Skin: Macular papular rash of the posterior side of upper half of left thigh, with severe tenderness and warm to touch, appears to have several small blisters but no significant ulcer.  Significant pannus?  Scrotum swelling. Neurologic: CN 2-12 grossly intact. Sensation intact, DTR normal. Strength 5/5 in all 4.  Psychiatric: Normal judgment and insight. Alert and oriented x 3. Normal mood.       Labs on Admission: I have personally reviewed following labs and imaging studies  CBC: Recent Labs  Lab 01/24/24 0141  WBC 10.0  NEUTROABS 7.2  HGB 12.0*  HCT 38.6*  MCV 90.2  PLT 438*   Basic Metabolic Panel: Recent Labs  Lab 01/24/24 0141  NA 139  K 3.8  CL 99  CO2 25  GLUCOSE 169*  BUN 14  CREATININE 0.73  CALCIUM 9.0   GFR: Estimated Creatinine Clearance: 174.5 mL/min (by C-G formula based on SCr of 0.73 mg/dL). Liver Function Tests: Recent Labs  Lab 01/24/24 0141  AST 16  ALT 9  ALKPHOS 84  BILITOT 0.5  PROT 7.7  ALBUMIN 3.9   No results for input(s): LIPASE, AMYLASE in the last 168 hours. No results for input(s): AMMONIA in the last 168 hours. Coagulation Profile: No results for input(s): INR, PROTIME in the last 168 hours. Cardiac Enzymes: No results for input(s): CKTOTAL, CKMB, CKMBINDEX, TROPONINI in the last 168 hours. BNP (last 3 results) No results for input(s): PROBNP in the last 8760 hours. HbA1C: No results for input(s): HGBA1C in the last 72 hours. CBG: No results for input(s): GLUCAP in the last 168 hours. Lipid Profile: No results for input(s): CHOL, HDL, LDLCALC, TRIG, CHOLHDL, LDLDIRECT in the last 72 hours. Thyroid Function Tests: No results for input(s): TSH, T4TOTAL, FREET4, T3FREE, THYROIDAB in the last 72 hours. Anemia Panel: No results for input(s): VITAMINB12, FOLATE, FERRITIN, TIBC, IRON, RETICCTPCT in the last 72 hours. Urine analysis:     Component Value Date/Time   COLORURINE Yellow 07/09/2011 1235   APPEARANCEUR Clear 07/09/2011 1235   LABSPEC 1.020 07/09/2011 1235   PHURINE 7.0 07/09/2011 1235   GLUCOSEU Negative 07/09/2011 1235   HGBUR Negative 07/09/2011 1235   BILIRUBINUR Negative 07/09/2011 1235   KETONESUR Negative 07/09/2011 1235   PROTEINUR Negative 07/09/2011 1235   NITRITE Negative 07/09/2011 1235   LEUKOCYTESUR Negative 07/09/2011 1235    Radiological Exams on Admission: No results found.  EKG: NOne  Assessment/Plan Principal Problem:   Cellulitis of left thigh  (please populate well all problems here in Problem List. (For example, if patient is on BP meds at home and you resume or decide to hold them, it is a problem that needs to be her. Same for CAD, COPD, HLD and so on)  Left posterior thigh cellulitis Lower abdominal pannus cellulitis - Rule out abscess, ordered CT left thigh with  contrast. - Due to the body habitus cannot distinguishly identified scrotum versus pannus, will order a ultrasound of scrotum. - Continue vancomycin  and cefepime  as per cellulitis protocol. - Consult wound care - Other DDx, clinically low suspicion for Fournier's gangrene  Chronic lymphedema Anasarca - Start IV Lasix  40 mg twice daily - Outpatient echocardiogram  Elevated lactic acid - Not responding to IV fluid bolus and lactic acid level remained stable. - Clinically patient appears to be euvolemic, and low suspicion for sepsis.  Elevation of lactic acid probably secondary to metformin.  Morbid obesity - BMI> 80 - Patient is unsure about his medical insurance.  Will ask case management to help.  Indication for bariatric surgery versus GLP-1 agonist therapy  HTN - Continue losartan  IIDM - Hold on metformin as lactic acid level elevated  Anxiety/depression - Continue SSRI  DVT prophylaxis: Lovenox  Code Status: Full code Family Communication: None at bedside Disposition Plan: Patient sick with  extensive cellulitis of left leg, expect antibiotic treatment for more than 2 days, condition is complicated with baseline morbid obesity. Consults called: None Admission status: MedSurg admission   Cort ONEIDA Mana MD [facility] Pager [phone]  01/24/2024, 11:10 AM        [1] No Known Allergies  "

## 2024-01-24 NOTE — Progress Notes (Signed)
 Pharmacy Antibiotic Note  Jack Owens is a 58 y.o. male admitted on 01/24/2024 with cellulitis.  Pharmacy has been consulted for Cefepime  and Vancomycin  dosing for 7 days.  Plan: Cefepime  2 gm q8hr per indication & renal fxn.  Pt given Vancomycin  2000 mg once. Vancomycin  2000 mg IV Q 12 hrs. Goal AUC 400-550. Expected AUC: 453.4 SCr used: 0.73, Vd used: 0.5, BMI: 82.2  Follow up culture results to assess for antibiotic optimization. Monitor renal function to assess for any necessary antibiotic dosing changes. Pharmacy will continue to follow and will adjust abx dosing whenever warranted.  Temp (24hrs), Avg:98.5 F (36.9 C), Min:98.4 F (36.9 C), Max:98.6 F (37 C)   Recent Labs  Lab 01/24/24 0141 01/24/24 0142 01/24/24 0245  WBC 10.0  --   --   CREATININE 0.73  --   --   LATICACIDVEN  --  2.5* 2.5*    Estimated Creatinine Clearance: 174.5 mL/min (by C-G formula based on SCr of 0.73 mg/dL).    Allergies[1]  Antimicrobials this admission: 12/28 Ceftriaxone  >> x 1 dose 12/28 Vancomycin  >> x 7 days 12/28 Cefepime  >> x 7 days  Microbiology results: No lab cx currently ordered or pending at this time.  Thank you for allowing pharmacy to be a part of this patients care.  Rankin CANDIE Dills, PharmD, [facility] 01/24/2024 6:06 AM     [1] No Known Allergies

## 2024-01-24 NOTE — Consult Note (Addendum)
 WOC Nurse Consult Note: Reason for Consult: L posterior thigh/scrotal wound  Wound type:1.  full thickness L posterior thigh likely r/t moisture and friction  2.  Intertriginous dermatitis underneath pannus/abdominal folds erythema with scattered partial thickness skin loss  ICD-10 CM Codes for Irritant Dermatitis  L30.4  - Erythema intertrigo. Also used for abrasion of the hand, chafing of the skin, dermatitis due to sweating and friction, friction dermatitis, friction eczema, and genital/thigh intertrigo.  Pressure Injury POA: NA  Measurement: see nursing flowsheet  Wound azi:dnfz linear partial thickness wounds with tan tissue present; full thickness round wound noted with tan tissue  Drainage (amount, consistency, odor) see nursing flowsheet  Periwound: erythema  Dressing procedure/placement/frequency: Cleanse L posterior thigh/scrotal wounds with Vashe, do not rinse. Apply Xeroform gauze ([place] [phone]) to wound beds daily and secure with silicone foam or ABD pad and clothe tape whichever is preferred.  Cleanse underneath pannus/abdominal folds with soap and water, dry and apply Interdry AG as follows: Order Gerlean # [phone] Measure and cut length of InterDry to fit in skin folds that have skin breakdown Tuck InterDry fabric into skin folds in a single layer, allow for 2 inches of overhang from skin edges to allow for wicking to occur May remove to bathe; dry area thoroughly and then tuck into affected areas again Do not apply any creams or ointments when using InterDry DO NOT THROW AWAY FOR 5 DAYS unless soiled with stool DO NOT [facility] product, this will inactivate the silver in the material  New sheet of Interdry should be applied after 5 days of use if patient continues to have skin breakdown     Will write for Gerhardt's Butt Cream to groin/inner thighs/buttocks 2 times daily and prn soiling.   POC discussed with bedside nurse.  Appreciate ALONSO Spies, LPN assistance with this consult.    WOC team will not follow. REconsult if further needs arise.    Thank you,    Powell Bar MSN, RN-BC, [employer]

## 2024-01-24 NOTE — Plan of Care (Signed)
" °  Problem: Education: Goal: Knowledge of General Education information will improve Description: Including pain rating scale, medication(s)/side effects and non-pharmacologic comfort measures Outcome: Not Progressing   Problem: Health Behavior/Discharge Planning: Goal: Ability to manage health-related needs will improve Outcome: Not Progressing   Problem: Clinical Measurements: Goal: Ability to maintain clinical measurements within normal limits will improve Outcome: Not Progressing Goal: Will remain free from infection Outcome: Not Progressing Goal: Diagnostic test results will improve Outcome: Not Progressing Goal: Respiratory complications will improve Outcome: Not Progressing Goal: Cardiovascular complication will be avoided Outcome: Not Progressing   Problem: Activity: Goal: Risk for activity intolerance will decrease Outcome: Not Progressing   Problem: Nutrition: Goal: Adequate nutrition will be maintained Outcome: Not Progressing   Problem: Coping: Goal: Level of anxiety will decrease Outcome: Not Progressing   Problem: Elimination: Goal: Will not experience complications related to bowel motility Outcome: Not Progressing Goal: Will not experience complications related to urinary retention Outcome: Not Progressing   Problem: Pain Managment: Goal: General experience of comfort will improve and/or be controlled Outcome: Not Progressing   Problem: Safety: Goal: Ability to remain free from injury will improve Outcome: Not Progressing   Problem: Skin Integrity: Goal: Risk for impaired skin integrity will decrease Outcome: Not Progressing  New admit, no progress yet  "

## 2024-01-24 NOTE — ED Notes (Signed)
 Pt unplugged from vital monitoring so pt could use the restroom. Pt stated he was about to complete this task independently.

## 2024-01-24 NOTE — ED Notes (Signed)
 1st set blood cultures and repeat lactic acid sent to lab

## 2024-01-24 NOTE — ED Provider Notes (Signed)
 "  [facility] Provider Note    Event Date/Time   First MD Initiated Contact with Patient 01/24/24 0222     (approximate)   History   Wound Check   HPI  Jack Owens is a 58 y.o. male  who presents to the emergency department today because of concern for wound. He states that he has had issues with lymphedema for years. He has been going to his primary care physician for help with it. Says that it seems to have gotten worse recently. It is painful. Today he noticed some blood coming from it.        Physical Exam   Triage Vital Signs: ED Triage Vitals  Encounter Vitals Group     BP 01/24/24 0140 (!) 169/80     Girls Systolic BP Percentile --      Girls Diastolic BP Percentile --      Boys Systolic BP Percentile --      Boys Diastolic BP Percentile --      Pulse Rate 01/24/24 0140 88     Resp 01/24/24 0140 18     Temp 01/24/24 0140 98.6 F (37 C)     Temp Source 01/24/24 0140 Oral     SpO2 01/24/24 0140 95 %     Weight 01/24/24 0139 (!) 480 lb (217.7 kg)     Height 01/24/24 0139 5' 4 (1.626 m)     Head Circumference --      Peak Flow --      Pain Score 01/24/24 0139 10     Pain Loc --      Pain Education --      Exclude from Growth Chart --     Most recent vital signs: Vitals:   01/24/24 0140  BP: (!) 169/80  Pulse: 88  Resp: 18  Temp: 98.6 F (37 C)  SpO2: 95%    General: Awake, alert, oriented. CV:  Good peripheral perfusion. Regular rate and rhythm. Resp:  Normal effort. Lungs clear. Abd:  No distention.  Other:  Large amount of lymphedema to bilateral lower legs. Left thigh with erythema, open wound, malodor.    ED Results / Procedures / Treatments   Labs (all labs ordered are listed, but only abnormal results are displayed) Labs Reviewed  LACTIC ACID, PLASMA - Abnormal; Notable for the following components:      Result Value   Lactic Acid, Venous 2.5 (*)    All other components within normal limits  LACTIC ACID,  PLASMA - Abnormal; Notable for the following components:   Lactic Acid, Venous 2.5 (*)    All other components within normal limits  COMPREHENSIVE METABOLIC PANEL WITH GFR - Abnormal; Notable for the following components:   Glucose, Bld 169 (*)    All other components within normal limits  CBC WITH DIFFERENTIAL/PLATELET - Abnormal; Notable for the following components:   Hemoglobin 12.0 (*)    HCT 38.6 (*)    Platelets 438 (*)    All other components within normal limits     EKG  None   RADIOLOGY None  PROCEDURES:  Critical Care performed: No    MEDICATIONS ORDERED IN ED: Medications - No data to display   IMPRESSION / MDM / ASSESSMENT AND PLAN / ED COURSE  I reviewed the triage vital signs and the nursing notes.  Differential diagnosis includes, but is not limited to, cellulitis, abscess  Patient's presentation is most consistent with acute presentation with potential threat to life or bodily function.   Patient presented to the open wound.  Patient does have an area of erythema no apparent fluctuance.  At this time I have low concern for abscess.  No fever, no leukocytosis.  Patient did have slight lactic acidosis.  Unclear if this is due to infection or possible problems.  Did however think patient benefit from IV fluids discussed with Dr. Lawence with the hospitalist service who will evaluate for admission     FINAL CLINICAL IMPRESSION(S) / ED DIAGNOSES   Final diagnoses:  Cellulitis, unspecified cellulitis site      Note:  This document was prepared using Dragon voice recognition software and may include unintentional dictation errors.    Floy Roberts, MD 01/24/24 [phone]  "

## 2024-01-24 NOTE — ED Triage Notes (Signed)
 Pt arrived via POV with reports of lymphedema to pannus area, pt reports open wounds and increased pain for several days.  Pt states he is not able to sit due to the pain. States he was going to [facility] to have it wrapped, reports ongoing issue for 3-4 years.

## 2024-01-25 DIAGNOSIS — E785 Hyperlipidemia, unspecified: Secondary | ICD-10-CM

## 2024-01-25 DIAGNOSIS — I890 Lymphedema, not elsewhere classified: Secondary | ICD-10-CM

## 2024-01-25 DIAGNOSIS — L03116 Cellulitis of left lower limb: Secondary | ICD-10-CM | POA: Diagnosis not present

## 2024-01-25 DIAGNOSIS — R7989 Other specified abnormal findings of blood chemistry: Secondary | ICD-10-CM

## 2024-01-25 DIAGNOSIS — I10 Essential (primary) hypertension: Secondary | ICD-10-CM

## 2024-01-25 DIAGNOSIS — F32A Depression, unspecified: Secondary | ICD-10-CM | POA: Diagnosis not present

## 2024-01-25 DIAGNOSIS — E119 Type 2 diabetes mellitus without complications: Secondary | ICD-10-CM

## 2024-01-25 DIAGNOSIS — E66813 Obesity, class 3: Secondary | ICD-10-CM | POA: Diagnosis not present

## 2024-01-25 LAB — BASIC METABOLIC PANEL WITH GFR
Anion gap: 11 (ref 5–15)
BUN: 13 mg/dL (ref 6–20)
CO2: 28 mmol/L (ref 22–32)
Calcium: 8.6 mg/dL — ABNORMAL LOW (ref 8.9–10.3)
Chloride: 103 mmol/L (ref 98–111)
Creatinine, Ser: 0.76 mg/dL (ref 0.61–1.24)
GFR, Estimated: >60 mL/min
Glucose, Bld: 117 mg/dL — ABNORMAL HIGH (ref 70–99)
Potassium: 3.9 mmol/L (ref 3.5–5.1)
Sodium: 142 mmol/L (ref 135–145)

## 2024-01-25 LAB — GLUCOSE, CAPILLARY
Glucose-Capillary: 124 mg/dL — ABNORMAL HIGH (ref 70–99)
Glucose-Capillary: 152 mg/dL — ABNORMAL HIGH (ref 70–99)
Glucose-Capillary: 169 mg/dL — ABNORMAL HIGH (ref 70–99)
Glucose-Capillary: 143 mg/dL — ABNORMAL HIGH (ref 70–99)

## 2024-01-25 NOTE — Assessment & Plan Note (Signed)
-   Continue home Celexa and Wellbutrin

## 2024-01-25 NOTE — Assessment & Plan Note (Signed)
 Blood pressure within goal. - Continue home lisinopril . - Currently getting IV Lasix 

## 2024-01-25 NOTE — Evaluation (Signed)
 Occupational Therapy Evaluation Patient Details Name: Jack Owens MRN: [id number] DOB: 27-Jun-1965 Today's Date: 01/25/2024   History of Present Illness   Jack Owens is a 58 y.o. male with medical history significant of morbid obesity, IIDM, HLD, HTN, chronic lymphedema, presented with worsening of left leg rash swelling pain.     Patient lives in a mobile home lives, lifestyles very much sedentary due to morbid obesity.  He has a chronic lymphedema and 3 days ago he started to feel swelling pain and rash of the backside of the thigh and pannus.  Denied any fever or chills no trouble urinating or moving his bowels.     Clinical Impressions Patient seen for OT treatment on this date. Upon arrival to room patient resting in bed, agreeable to treatment. 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 Patient able to transition to EOB with HOB raised with CGA; performed SPT from EOB to recliner with SBA/CGA; noted he needs a bariatric recliner, it was delivered after OT eval.  Patient ended treatment in recliner with bed/chair alarm on and all needs within reach. Patient making good progress toward goals, will continue to follow POC. Discharge recommendation remains appropriate.       If plan is discharge home, recommend the following:   A little help with walking and/or transfers;A little help with bathing/dressing/bathroom     Functional Status Assessment   Patient has had a recent decline in their functional status and demonstrates the ability to make significant improvements in function in a reasonable and predictable amount of time.     Equipment Recommendations         Recommendations for Other Services         Precautions/Restrictions   Precautions Precautions: Fall Recall of Precautions/Restrictions: Intact Restrictions Weight Bearing Restrictions Per Provider  Order: No     Mobility Bed Mobility Overal bed mobility: Needs Assistance Bed Mobility: Supine to Sit     Supine to sit: Min assist          Transfers Overall transfer level: Needs assistance   Transfers: Bed to chair/wheelchair/BSC       Step pivot transfers: Contact guard assist            Balance Overall balance assessment: Needs assistance Sitting-balance support: Feet supported, No upper extremity supported Sitting balance-Leahy Scale: Normal     Standing balance support: During functional activity Standing balance-Leahy Scale: Fair                             ADL either performed or assessed with clinical judgement   ADL Overall ADL's : At baseline                                       General ADL Comments: patient with limited ADL performance at baseline     Vision         Perception         Praxis         Pertinent Vitals/Pain Pain Assessment Pain Assessment: 0-10 Pain Score: 4  Pain Location: head Pain Descriptors / Indicators: Aching Pain Intervention(s): Patient requesting pain meds-RN notified     Extremity/Trunk Assessment Upper Extremity Assessment Upper Extremity Assessment: Overall WFL for tasks assessed  Lower Extremity Assessment Lower Extremity Assessment: Defer to PT evaluation       Communication Communication Communication: No apparent difficulties   Cognition Arousal: Alert Behavior During Therapy: WFL for tasks assessed/performed Cognition: No apparent impairments                               Following commands: Intact       Cueing  General Comments   Cueing Techniques: Verbal cues  lympehdema to LLE and panus   Exercises     Shoulder Instructions      Home Living Family/patient expects to be discharged to:: Shelter/Homeless                                 Additional Comments: patient lives in a [place]      Prior  Functioning/Environment Prior Level of Function : Needs assist             Mobility Comments: ambulates short distances without AD, but then will use motorized carts in stores ADLs Comments: able to dress self, bathes using basin    OT Problem List: Decreased strength;Decreased activity tolerance   OT Treatment/Interventions: Self-care/ADL training;Therapeutic exercise;Energy conservation;DME and/or AE instruction      OT Goals(Current goals can be found in the care plan section)   Acute Rehab OT Goals Patient Stated Goal: to get stronger OT Goal Formulation: With patient Time For Goal Achievement: 02/08/24 Potential to Achieve Goals: Good ADL Goals Pt Will Perform Grooming: with modified independence;standing Pt Will Transfer to Toilet: with modified independence;ambulating;regular height toilet Pt Will Perform Toileting - Clothing Manipulation and hygiene: with modified independence;with adaptive equipment;sit to/from stand   OT Frequency:  Min 2X/week    Co-evaluation              AM-PAC OT 6 Clicks Daily Activity     Outcome Measure Help from another person eating meals?: None Help from another person taking care of personal grooming?: None Help from another person toileting, which includes using toliet, bedpan, or urinal?: A Little Help from another person bathing (including washing, rinsing, drying)?: A Little Help from another person to put on and taking off regular upper body clothing?: None Help from another person to put on and taking off regular lower body clothing?: A Little 6 Click Score: 21   End of Session Nurse Communication: Mobility status  Activity Tolerance: Patient tolerated treatment well Patient left: in chair;with call bell/phone within reach;with nursing/sitter in room  OT Visit Diagnosis: Muscle weakness (generalized) (M62.81);Unsteadiness on feet (R26.81)                Time: 8497-8468 OT Time Calculation (min): 29 min Charges:   OT General Charges $OT Visit: 1 Visit OT Evaluation $OT Eval Low Complexity: 1 Low  Rogers Clause, OT/L MSOT, 01/25/2024

## 2024-01-25 NOTE — Hospital Course (Addendum)
 Partly taken from H&P.   Jack Owens is a 58 y.o. male with medical history significant of morbid obesity, IIDM, HLD, HTN, chronic lymphedema, presented with worsening of left leg wound with pain, erythema and edema.  Patient also has an history of chronic lymphedema and wounds.  No fever and chills.  Came to ED to get some IV antibiotics.  On presentation stable vitals, no leukocytosis, mildly elevated lactic acid but patient was also taking metformin.  Rest of the labs at baseline.  CT scan of left thigh ordered to rule out any abscess. Started on cefepime  and vancomycin .  12/29: Vital stable, lower extremity venous Doppler negative for DVT.  Scrotal ultrasound was negative for any acute abnormality, did noted a small right epididymal dermal head cyst. CT of left thigh was suboptimal due to body habitus, did show subcutaneous thickening with no discrete loculated fluid collection.  Also shows multiple prominent left inguinal lymph nodes likely reactive.  No osseous abnormality.  Also noted to have significant left knee osteoarthritis which need to be investigated by orthopedic surgery as outpatient.  Patient is currently homeless, living in his fle[place], poor personal hygiene.  TOC was consulted to provide him with resources.  12/30: Remained hemodynamically stable with significant improvement in his thigh erythema, antibiotic switched with p.o. Augmentin  and doxycycline .  PT is recommending SNF-TOC is working on it, might be difficult disposition due to limited Medicaid beds availability. Discontinuing IV Lasix  12/31.  Blood pressure on the lower side will give a fluid bolus.  Holding antihypertensive medication. 01/28/2024.  Still waiting rehab referrals 1/2.  TOC expanding bed search for rehab. 1/3.  Patient thinking about going home and not waiting for rehab bed.  Because he lives in his fleeta we are unable to set up home health to take care of this wound.  He will let me know on how he wants to  proceed. 1/5.  Still unable to find a rehab bed.  TOC called over to [facility] and they are unable to accommodate him.  Patient wants to go home to his [place] and not out of [place] for rehab.  Plan will be to discharge home tomorrow.  Will refer to wound care center. 1/6.  Unable to find a rehab bed with his weight and insurance.  Unable to set up home health because he lives in his fleeta.  Patient will discharge home with follow-up at his medical doctor.  Referred to wound care center.

## 2024-01-25 NOTE — Assessment & Plan Note (Signed)
 Continue home pravastatin

## 2024-01-25 NOTE — Assessment & Plan Note (Signed)
 Estimated body mass index is 82.39 kg/m as calculated from the following:   Height as of this encounter: 5' 4 (1.626 m).   Weight as of this encounter: 217.7 kg.   - This will complicate overall prognosis -Encouraged weight loss

## 2024-01-25 NOTE — Progress Notes (Signed)
 " Progress Note   Patient: Jack Owens FMW:[id number] DOB: 06/16/1965 DOA: 01/24/2024     1 DOS: the patient was seen and examined on 01/25/2024   Brief hospital course: Partly taken from H&P.   Jack Owens is a 58 y.o. male with medical history significant of morbid obesity, IIDM, HLD, HTN, chronic lymphedema, presented with worsening of left leg wound with pain, erythema and edema.  Patient also has an history of chronic lymphedema and wounds.  No fever and chills.  Came to ED to get some IV antibiotics.  On presentation stable vitals, no leukocytosis, mildly elevated lactic acid but patient was also taking metformin.  Rest of the labs at baseline.  CT scan of left thigh ordered to rule out any abscess. Started on cefepime  and vancomycin .  12/29: Vital stable, lower extremity venous Doppler negative for DVT.  Scrotal ultrasound was negative for any acute abnormality, did noted a small right epididymal dermal head cyst. CT of left thigh was suboptimal due to body habitus, did show subcutaneous thickening with no discrete loculated fluid collection.  Also shows multiple prominent left inguinal lymph nodes likely reactive.  No osseous abnormality.  Also noted to have significant left knee osteoarthritis which need to be investigated by orthopedic surgery as outpatient.  Patient is currently homeless, living in his fle[place], poor personal hygiene.  TOC was consulted to provide him with resources.  Assessment and Plan: * Cellulitis of left thigh Cellulitis of left posterior thigh and lower abdominal pannus. CT thigh and scrotal ultrasound was negative for any abscess or acute abnormality.  Did show superficial edema consistent with cellulitis.  Improving erythema today. Poor hygienic conditions as he was living in his fleeta and was not taking showers. -Continuing with cefepime  and vancomycin  today.  Chronic acquired lymphedema There was some concern of superadded edema. -Continue with IV Lasix   for another day.  Elevated lactic acid level Persistent mild lactic acidosis not responding to IV fluid.  No leukocytosis. Likely secondary to his metformin use. - Keep holding metformin -Patient will get benefit from switching to a different medication by PCP.  Diabetes mellitus without complication (HCC) Seems well-controlled with A1c of 6.6 and CBG within goal. -Holding home metformin -Continue with SSI  Hypertension Blood pressure within goal. - Continue home lisinopril . - Currently getting IV Lasix   Depression -Continue home Celexa  and Wellbutrin   Hyperlipidemia - Continue home pravastatin   Obesity, Class III, BMI 40-49.9 (morbid obesity) (HCC) Estimated body mass index is 82.39 kg/m as calculated from the following:   Height as of this encounter: 5' 4 (1.626 m).   Weight as of this encounter: 217.7 kg.   - This will complicate overall prognosis -Encouraged weight loss   Subjective: Patient was seen and examined today.  No significant pain.  Poor living conditions as he lives in his [place].  Does not take shower only occasionally do sponge bath.  Physical Exam: Vitals:   01/24/24 1559 01/24/24 2016 01/25/24 0636 01/25/24 0740  BP: (!) 134/49 (!) 115/54 117/61 (!) 109/52  Pulse: 79 81 65 74  Resp: 17 (!) 21 19 17   Temp: 99.7 F (37.6 C) 99.1 F (37.3 C) 98.9 F (37.2 C) 98.4 F (36.9 C)  TempSrc:  Oral Axillary Oral  SpO2: 98% 95% 96% 92%  Weight:      Height:       General.  Morbidly obese gentleman, in no acute distress. Pulmonary.  Lungs clear bilaterally, normal respiratory effort. CV.  Regular rate and  rhythm, no JVD, rub or murmur. Abdomen.  Soft, nontender, nondistended, BS positive. CNS.  Alert and oriented .  No focal neurologic deficit. Extremities.  Bilateral lymphedema, no significant pitting edema, mild erythema of left medial and posterior thigh. Psychiatry.  Judgment and insight appears normal.   Data Reviewed: Prior data  reviewed  Family Communication: Discussed with daughter on phone  Disposition: Status is: Inpatient Remains inpatient appropriate because: Severity of illness  Planned Discharge Destination: Home  DVT prophylaxis.  Lovenox  Time spent: 50 minutes  This record has been created using Conservation officer, historic buildings. Errors have been sought and corrected,but may not always be located. Such creation errors do not reflect on the standard of care.   Author: Amaryllis Dare, MD 01/25/2024 2:51 PM  For on call review [email or web address].  "

## 2024-01-25 NOTE — Assessment & Plan Note (Signed)
 Seems well-controlled with A1c of 6.6 and CBG within goal. -Holding home metformin -Continue with SSI

## 2024-01-25 NOTE — Assessment & Plan Note (Signed)
 Persistent mild lactic acidosis not responding to IV fluid.  No leukocytosis. Likely secondary to his metformin use. - Keep holding metformin -Patient will get benefit from switching to a different medication by PCP.

## 2024-01-25 NOTE — Plan of Care (Signed)
" °  Problem: Tissue Perfusion: Goal: Adequacy of tissue perfusion will improve Outcome: Progressing   Problem: Skin Integrity: Goal: Risk for impaired skin integrity will decrease Outcome: Progressing   Problem: Nutritional: Goal: Maintenance of adequate nutrition will improve Outcome: Progressing   Problem: Skin Integrity: Goal: Skin integrity will improve Outcome: Progressing   Problem: Elimination: Goal: Will not experience complications related to bowel motility Outcome: Progressing   "

## 2024-01-25 NOTE — Assessment & Plan Note (Signed)
 There was some concern of superadded edema. -Continue with IV Lasix  for another day.

## 2024-01-25 NOTE — Evaluation (Signed)
 Physical Therapy Evaluation Patient Details Name: Jack Owens MRN: [id number] DOB: [birth year] Today's Date: 01/25/2024  History of Present Illness  NASHUA HOMEWOOD is a 58 y.o. male with medical history significant of morbid obesity, IIDM, HLD, HTN, chronic lymphedema, presented with worsening of left leg rash swelling pain.     Patient lives in a mobile home lives, lifestyles very much sedentary due to morbid obesity.  He has a chronic lymphedema and 3 days ago he started to feel swelling pain and rash of the backside of the thigh and pannus.  Denied any fever or chills no trouble urinating or moving his bowels.  Clinical Impression  Patient noted to be in seated position at PT arrival in room, for an initial PT evaluation due to a decline in functional status, with baseline mobility reported as modI, and currently requiring supervision for transfers. The patient is A&O x 4, presenting with good willingness to work with PT. The patient resides in a [place] and lives alone. Gait was assessed with RW, limited by lower body relative strength deficits. Gait mechanic observations noted compensated trendelenburg during loading phase of gait bilaterally. The overall clinical impression is that the patient presents with moderate mobility limitations. Recommended skilled PT will address safety, mobility, and discharge planning.       If plan is discharge home, recommend the following: A little help with walking and/or transfers;A little help with bathing/dressing/bathroom   Can travel by private vehicle   No    Equipment Recommendations Rolling walker (2 wheels)  Recommendations for Other Services       Functional Status Assessment Patient has had a recent decline in their functional status and demonstrates the ability to make significant improvements in function in a reasonable and predictable amount of time.     Precautions / Restrictions Precautions Precautions: Fall Recall of  Precautions/Restrictions: Intact Restrictions Weight Bearing Restrictions Per Provider Order: No      Mobility  Bed Mobility Overal bed mobility: Needs Assistance Bed Mobility: Sit to Supine       Sit to supine: Min assist, Used rails, HOB elevated        Transfers Overall transfer level: Needs assistance   Transfers: Bed to chair/wheelchair/BSC     Step pivot transfers: Contact guard assist            Ambulation/Gait Ambulation/Gait assistance: Supervision Gait Distance (Feet): 6 Feet Assistive device: None Gait Pattern/deviations: Wide base of support, Trendelenburg Gait velocity: decreased     General Gait Details: able to take few steps forward and backwards at bedside; significant lateral weight shift during loading phase (compensated trendelenburg)  Stairs            Wheelchair Mobility     Tilt Bed    Modified Rankin (Stroke Patients Only)       Balance Overall balance assessment: Needs assistance Sitting-balance support: Feet supported, No upper extremity supported Sitting balance-Leahy Scale: Normal     Standing balance support: During functional activity Standing balance-Leahy Scale: Fair                               Pertinent Vitals/Pain Pain Assessment Pain Assessment: 0-10 Pain Score: 4  Pain Location: head Pain Descriptors / Indicators: Aching Pain Intervention(s): Monitored during session    Home Living Family/patient expects to be discharged to:: Shelter/Homeless  Additional Comments: patient lives in a [place]    Prior Function Prior Level of Function : Needs assist             Mobility Comments: ambulates short distances without AD, but then will use motorized carts in stores ADLs Comments: able to dress self, bathes using basin     Extremity/Trunk Assessment   Upper Extremity Assessment Upper Extremity Assessment: Overall WFL for tasks assessed    Lower Extremity  Assessment Lower Extremity Assessment: Generalized weakness;Overall [facility] for tasks assessed    Cervical / Trunk Assessment Cervical / Trunk Assessment: Kyphotic  Communication   Communication Communication: No apparent difficulties    Cognition Arousal: Alert Behavior During Therapy: WFL for tasks assessed/performed                             Following commands: Intact       Cueing Cueing Techniques: Verbal cues     General Comments General comments (skin integrity, edema, etc.): lympehdema to LLE and panus    Exercises     Assessment/Plan    PT Assessment Patient needs continued PT services  PT Problem List Decreased strength;Decreased activity tolerance;Decreased balance;Decreased mobility;Decreased safety awareness       PT Treatment Interventions Gait training;Functional mobility training;Therapeutic activities;Therapeutic exercise;Patient/family education;Neuromuscular re-education;Balance training    PT Goals (Current goals can be found in the Care Plan section)  Acute Rehab PT Goals Patient Stated Goal: unstated PT Goal Formulation: With patient    Frequency Min 2X/week     Co-evaluation               AM-PAC PT 6 Clicks Mobility  Outcome Measure Help needed turning from your back to your side while in a flat bed without using bedrails?: A Little Help needed moving from lying on your back to sitting on the side of a flat bed without using bedrails?: A Little Help needed moving to and from a bed to a chair (including a wheelchair)?: A Little Help needed standing up from a chair using your arms (e.g., wheelchair or bedside chair)?: A Little Help needed to walk in hospital room?: A Lot Help needed climbing 3-5 steps with a railing? : Total 6 Click Score: 15    End of Session   Activity Tolerance: Patient tolerated treatment well Patient left: in bed;with call bell/phone within reach;with bed alarm set Nurse Communication: Mobility  status PT Visit Diagnosis: Other abnormalities of gait and mobility (R26.89)    Time: 1550-1603 PT Time Calculation (min) (ACUTE ONLY): 13 min   Charges:   PT Evaluation $PT Eval Low Complexity: 1 Low   PT General Charges $$ ACUTE PT VISIT: 1 Visit         Sherlean Lesches DPT, PT    Alphonsus Doyel A Milea Klink 01/25/2024, 4:18 PM

## 2024-01-25 NOTE — Assessment & Plan Note (Signed)
 Cellulitis of left posterior thigh and lower abdominal pannus. CT thigh and scrotal ultrasound was negative for any abscess or acute abnormality.  Did show superficial edema consistent with cellulitis.  Improving erythema today. Poor hygienic conditions as he was living in his fleeta and was not taking showers. -Continuing with cefepime  and vancomycin  today.

## 2024-01-26 DIAGNOSIS — I890 Lymphedema, not elsewhere classified: Secondary | ICD-10-CM | POA: Diagnosis not present

## 2024-01-26 DIAGNOSIS — E119 Type 2 diabetes mellitus without complications: Secondary | ICD-10-CM | POA: Diagnosis not present

## 2024-01-26 DIAGNOSIS — L03116 Cellulitis of left lower limb: Secondary | ICD-10-CM | POA: Diagnosis not present

## 2024-01-26 DIAGNOSIS — R7989 Other specified abnormal findings of blood chemistry: Secondary | ICD-10-CM | POA: Diagnosis not present

## 2024-01-26 LAB — BASIC METABOLIC PANEL WITH GFR
Anion gap: 11 (ref 5–15)
BUN: 22 mg/dL — ABNORMAL HIGH (ref 6–20)
CO2: 27 mmol/L (ref 22–32)
Calcium: 9.3 mg/dL (ref 8.9–10.3)
Chloride: 101 mmol/L (ref 98–111)
Creatinine, Ser: 1.03 mg/dL (ref 0.61–1.24)
GFR, Estimated: >60 mL/min
Glucose, Bld: 129 mg/dL — ABNORMAL HIGH (ref 70–99)
Potassium: 4.7 mmol/L (ref 3.5–5.1)
Sodium: 139 mmol/L (ref 135–145)

## 2024-01-26 LAB — GLUCOSE, CAPILLARY
Glucose-Capillary: 137 mg/dL — ABNORMAL HIGH (ref 70–99)
Glucose-Capillary: 141 mg/dL — ABNORMAL HIGH (ref 70–99)
Glucose-Capillary: 131 mg/dL — ABNORMAL HIGH (ref 70–99)
Glucose-Capillary: 120 mg/dL — ABNORMAL HIGH (ref 70–99)

## 2024-01-26 LAB — CBC
HCT: 36.4 % — ABNORMAL LOW (ref 39.0–52.0)
Hemoglobin: 11.2 g/dL — ABNORMAL LOW (ref 13.0–17.0)
MCH: 27.7 pg (ref 26.0–34.0)
MCHC: 30.8 g/dL (ref 30.0–36.0)
MCV: 89.9 fL (ref 80.0–100.0)
Platelets: 377 K/uL (ref 150–400)
RBC: 4.05 MIL/uL — ABNORMAL LOW (ref 4.22–5.81)
RDW: 14.2 % (ref 11.5–15.5)
WBC: 6.5 K/uL (ref 4.0–10.5)
nRBC: 0 % (ref 0.0–0.2)

## 2024-01-26 MED ORDER — AMOXICILLIN-POT CLAVULANATE 875-125 MG PO TABS
1.0000 | ORAL_TABLET | Freq: Two times a day (BID) | ORAL | Status: AC
  Administered 2024-01-26 – 2024-01-30 (×9): 1 via ORAL
  Filled 2024-01-26 (×8): qty 1

## 2024-01-26 MED ORDER — VANCOMYCIN HCL 1500 MG/300ML IV SOLN
1500.0000 mg | Freq: Two times a day (BID) | INTRAVENOUS | Status: DC
  Filled 2024-01-26: qty 300

## 2024-01-26 MED ORDER — SODIUM CHLORIDE 0.9 % IV SOLN
2.0000 g | INTRAVENOUS | Status: DC
  Filled 2024-01-26: qty 20

## 2024-01-26 MED ORDER — DOXYCYCLINE HYCLATE 100 MG PO TABS
100.0000 mg | ORAL_TABLET | Freq: Two times a day (BID) | ORAL | Status: AC
  Administered 2024-01-26 – 2024-01-30 (×10): 100 mg via ORAL
  Filled 2024-01-26 (×10): qty 1

## 2024-01-26 NOTE — Progress Notes (Signed)
 " Progress Note   Patient: Jack Owens FMW:[id number] DOB: Sep 07, 1965 DOA: 01/24/2024     2 DOS: the patient was seen and examined on 01/26/2024   Brief hospital course: Partly taken from H&P.   Jack Owens is a 58 y.o. male with medical history significant of morbid obesity, IIDM, HLD, HTN, chronic lymphedema, presented with worsening of left leg wound with pain, erythema and edema.  Patient also has an history of chronic lymphedema and wounds.  No fever and chills.  Came to ED to get some IV antibiotics.  On presentation stable vitals, no leukocytosis, mildly elevated lactic acid but patient was also taking metformin.  Rest of the labs at baseline.  CT scan of left thigh ordered to rule out any abscess. Started on cefepime  and vancomycin .  12/29: Vital stable, lower extremity venous Doppler negative for DVT.  Scrotal ultrasound was negative for any acute abnormality, did noted a small right epididymal dermal head cyst. CT of left thigh was suboptimal due to body habitus, did show subcutaneous thickening with no discrete loculated fluid collection.  Also shows multiple prominent left inguinal lymph nodes likely reactive.  No osseous abnormality.  Also noted to have significant left knee osteoarthritis which need to be investigated by orthopedic surgery as outpatient.  Patient is currently homeless, living in his fleeta, poor personal hygiene.  TOC was consulted to provide him with resources.  12/30: Remained hemodynamically stable with significant improvement in his thigh erythema, antibiotic switched with p.o. Augmentin  and doxycycline .  PT is recommending SNF-TOC is working on it, might be difficult disposition due to limited Medicaid beds availability. Discontinuing IV Lasix   Assessment and Plan: * Cellulitis of left thigh Cellulitis of left posterior thigh and lower abdominal pannus. CT thigh and scrotal ultrasound was negative for any abscess or acute abnormality.  Did show  superficial edema consistent with cellulitis.  Improving erythema today. Poor hygienic conditions as he was living in his fleeta and was not taking showers. Clinically now seems improving -Switching cefepime  and vancomycin  with Augmentin  and doxycycline  -Continue with supportive care  Chronic acquired lymphedema There was some concern of superadded edema. - Discontinuing further IV diuresis  Elevated lactic acid level Persistent mild lactic acidosis not responding to IV fluid.  No leukocytosis. Likely secondary to his metformin use. - Keep holding metformin -Patient will get benefit from switching to a different medication by PCP.  Diabetes mellitus without complication (HCC) Seems well-controlled with A1c of 6.6 and CBG within goal. -Holding home metformin -Continue with SSI  Hypertension Blood pressure within goal. - Continue home lisinopril . - Discontinuing IV Lasix , can start him on p.o. Lasix  if needed  Depression -Continue home Celexa  and Wellbutrin   Hyperlipidemia - Continue home pravastatin   Obesity, Class III, BMI 40-49.9 (morbid obesity) (HCC) Estimated body mass index is 82.39 kg/m as calculated from the following:   Height as of this encounter: 5' 4 (1.626 m).   Weight as of this encounter: 217.7 kg.   - This will complicate overall prognosis -Encouraged weight loss   Subjective: Patient was sitting comfortably in chair when seen today.  Having some pain in posterior thigh.  Physical Exam: Vitals:   01/25/24 2029 01/26/24 0352 01/26/24 0720 01/26/24 1546  BP: (!) 108/57 114/78 (!) 102/51 (!) 121/51  Pulse: 74 97 64 72  Resp: 18 18 16 17   Temp: 98.1 F (36.7 C) 98.1 F (36.7 C) 97.7 F (36.5 C) 97.9 F (36.6 C)  TempSrc:    Oral  SpO2: 97% 100% 95% 94%  Weight:      Height:       General.  Morbidly obese gentleman, in no acute distress. Pulmonary.  Lungs clear bilaterally, normal respiratory effort. CV.  Regular rate and rhythm, no JVD, rub or  murmur. Abdomen.  Soft, nontender, nondistended, BS positive. CNS.  Alert and oriented .  No focal neurologic deficit. Extremities.  Bilateral lymphedema, improving erythema of left thigh. Psychiatry.  Judgment and insight appears normal.    Data Reviewed: Prior data reviewed  Family Communication: Discussed with patient  Disposition: Status is: Inpatient Remains inpatient appropriate because: Severity of illness  Planned Discharge Destination: Home  DVT prophylaxis.  Lovenox  Time spent: 50 minutes  This record has been created using Conservation officer, historic buildings. Errors have been sought and corrected,but may not always be located. Such creation errors do not reflect on the standard of care.   Author: Amaryllis Dare, MD 01/26/2024 4:21 PM  For on call review [email or web address].  "

## 2024-01-26 NOTE — Assessment & Plan Note (Addendum)
 Cellulitis of left posterior thigh and lower abdominal pannus. CT thigh and scrotal ultrasound was negative for any abscess or acute abnormality.  Did show superficial edema consistent with cellulitis.   Poor hygienic conditions as he was living in his fleeta and was not taking showers. Clinically now seems improving - Initially on cefepime  and vancomycin  and switched over to Augmentin  and doxycycline .  Patient completed course of antibiotic. - Patient has a wound on a dependent pannus.

## 2024-01-26 NOTE — Assessment & Plan Note (Addendum)
 Improved during the hospital course.  Patient states he does not take metformin.

## 2024-01-26 NOTE — TOC Initial Note (Signed)
 Transition of Care [facility]) - Initial/Assessment Note    Patient Details  Name: Jack Owens MRN: [id number] Date of Birth: April 16, 1965  Transition of Care [facility] CM/SW Contact:    Alvaro Louder, LCSW Phone Number: 01/26/2024, 12:47 PM  Clinical Narrative:   Patient is homeless, currently staying in his vehicle. PCP is  Maurilio Pouch. TOC consulted for homelessness. [facility] provided patient with Homeless resources with shelter names and addresses in [place], [place], and [place].   [facility] has faxed the patient out for SNF and one facility is currently reviewing him as all others have declined due to not having any medicaid beds.   TOC to follow for discharge.                Patient Goals and CMS Choice            Expected Discharge Plan and Services                                              Prior Living Arrangements/Services                       Activities of Daily Living   ADL Screening (condition at time of admission) Independently performs ADLs?: No Does the patient have a NEW difficulty with bathing/dressing/toileting/self-feeding that is expected to last >3 days?: No Does the patient have a NEW difficulty with getting in/out of bed, walking, or climbing stairs that is expected to last >3 days?: No Does the patient have a NEW difficulty with communication that is expected to last >3 days?: No Is the patient deaf or have difficulty hearing?: No Does the patient have difficulty seeing, even when wearing glasses/contacts?: Yes Does the patient have difficulty concentrating, remembering, or making decisions?: No  Permission Sought/Granted                  Emotional Assessment              Admission diagnosis:  Cellulitis of left thigh [L03.116] Cellulitis, unspecified cellulitis site [L03.90] Patient Active Problem List   Diagnosis Date Noted   Chronic acquired lymphedema 01/25/2024   Elevated lactic acid level 01/25/2024    Obesity, Class III, BMI 40-49.9 (morbid obesity) (HCC) 01/25/2024   Diabetes mellitus without complication (HCC)    Hypertension    Depression    Hyperlipidemia    Cellulitis of left thigh 01/24/2024   Redundant skin 08/03/2012   PCP:  Pouch Maurilio, MD Pharmacy:   [facility] - [place], [place] - [address] [place] [address] Phone: [phone] Fax: [phone]     Social Drivers of Health (SDOH) Social History: SDOH Screenings   Food Insecurity: Food Insecurity Present (01/24/2024)  Housing: High Risk (01/24/2024)  Transportation Needs: No Transportation Needs (01/24/2024)  Utilities: Not At Risk (01/24/2024)  Tobacco Use: Low Risk (01/24/2024)   SDOH Interventions:     Readmission Risk Interventions     No data to display

## 2024-01-26 NOTE — TOC CM/SW Note (Signed)
 Transition of Care (TOC) CM/SW Note   To Whom It May Concern:   Please be advised that the above-named patient will require a short-term nursing home stay - anticipated 30 days or less for rehabilitation and strengthening.  The plan is for return home

## 2024-01-26 NOTE — Progress Notes (Signed)
 Physical Therapy Treatment Patient Details Name: Jack Owens MRN: [id number] DOB: 1965/06/20 Today's Date: 01/26/2024   History of Present Illness Jack Owens is a 58 y.o. male with medical history significant of morbid obesity, IIDM, HLD, HTN, chronic lymphedema, presented with worsening of left leg rash swelling pain.     Patient lives in a mobile home lives, lifestyles very much sedentary due to morbid obesity.  He has a chronic lymphedema and 3 days ago he started to feel swelling pain and rash of the backside of the thigh and pannus.  Denied any fever or chills no trouble urinating or moving his bowels.    PT Comments  Pt ready to get up.  He is able to get to EOB with rails but no physical assist.  Stands by rocking and initially opts to hold my hand and end of bed for support but soon lets go and walks around bed to recliner.  Pt opts not to wear gown or clothing for mobility only wanting a sheet once in chair.  Gait generally unsteady due to body habitus but needs not outside intervention.  He stated he occasionally uses a SPC at home because I found it.  He may benefit from a bariatric RW but seems generally comfortable without AD for his limited gait in room.   If plan is discharge home, recommend the following: A little help with walking and/or transfers;A little help with bathing/dressing/bathroom   Can travel by private vehicle        Equipment Recommendations  Rolling walker (2 wheels)    Recommendations for Other Services       Precautions / Restrictions Precautions Precautions: Fall Recall of Precautions/Restrictions: Intact Restrictions Weight Bearing Restrictions Per Provider Order: No     Mobility  Bed Mobility Overal bed mobility: Modified Independent Bed Mobility: Supine to Sit     Supine to sit: Used rails, HOB elevated     General bed mobility comments: no outside assit needed Patient Response: Cooperative  Transfers Overall transfer level:  Needs assistance Equipment used: None Transfers: Sit to/from Stand Sit to Stand: Supervision, Modified independent (Device/Increase time)                Ambulation/Gait Ambulation/Gait assistance: Supervision Gait Distance (Feet): 10 Feet Assistive device: None, 1 person hand held assist Gait Pattern/deviations: Wide base of support, Trendelenburg Gait velocity: decreased     General Gait Details: able to walk around bed to recliner.  opts initially to use bed and my hand for support but then is able to walk to chair unassisted   Stairs             Wheelchair Mobility     Tilt Bed Tilt Bed Patient Response: Cooperative  Modified Rankin (Stroke Patients Only)       Balance Overall balance assessment: Needs assistance Sitting-balance support: Feet supported, No upper extremity supported Sitting balance-Leahy Scale: Good     Standing balance support: During functional activity Standing balance-Leahy Scale: Fair                              Hotel Manager: No apparent difficulties  Cognition Arousal: Alert Behavior During Therapy: WFL for tasks assessed/performed   PT - Cognitive impairments: No apparent impairments                         Following commands: Intact  Cueing Cueing Techniques: Verbal cues  Exercises      General Comments        Pertinent Vitals/Pain Pain Assessment Pain Assessment: No/denies pain Pain Location: voiced no c/o Pain Intervention(s): Monitored during session, Repositioned    Home Living                          Prior Function            PT Goals (current goals can now be found in the care plan section) Progress towards PT goals: Progressing toward goals    Frequency    Min 2X/week      PT Plan      Co-evaluation              AM-PAC PT 6 Clicks Mobility   Outcome Measure  Help needed turning from your back to your side  while in a flat bed without using bedrails?: A Little Help needed moving from lying on your back to sitting on the side of a flat bed without using bedrails?: A Little Help needed moving to and from a bed to a chair (including a wheelchair)?: None Help needed standing up from a chair using your arms (e.g., wheelchair or bedside chair)?: None Help needed to walk in hospital room?: A Little Help needed climbing 3-5 steps with a railing? : A Lot 6 Click Score: 19    End of Session   Activity Tolerance: Patient tolerated treatment well Patient left: in chair;with nursing/sitter in room Nurse Communication: Mobility status PT Visit Diagnosis: Other abnormalities of gait and mobility (R26.89)     Time: 8986-8977 PT Time Calculation (min) (ACUTE ONLY): 9 min  Charges:    $Gait Training: 8-22 mins PT General Charges $$ ACUTE PT VISIT: 1 Visit                   Lauraine Gills, PTA 01/26/2024, 10:41 AM

## 2024-01-26 NOTE — Assessment & Plan Note (Signed)
 Blood pressure within goal. - Continue home lisinopril . - Discontinuing IV Lasix , can start him on p.o. Lasix  if needed

## 2024-01-26 NOTE — Plan of Care (Signed)

## 2024-01-26 NOTE — Assessment & Plan Note (Signed)
 There was some concern of superadded edema. - Discontinuing further IV diuresis

## 2024-01-26 NOTE — Plan of Care (Signed)
" °  Problem: Skin Integrity: Goal: Risk for impaired skin integrity will decrease Outcome: Progressing   Problem: Nutritional: Goal: Maintenance of adequate nutrition will improve Outcome: Progressing   Problem: Health Behavior/Discharge Planning: Goal: Ability to identify and utilize available resources and services will improve Outcome: Progressing   Problem: Education: Goal: Ability to describe self-care measures that may prevent or decrease complications (Diabetes Survival Skills Education) will improve Outcome: Progressing   Problem: Elimination: Goal: Will not experience complications related to bowel motility Outcome: Progressing   "

## 2024-01-26 NOTE — [place] FL2 (Signed)
 " [place]  MEDICAID FL2 LEVEL OF CARE FORM     IDENTIFICATION  Patient Name: Jack Owens Birthdate: [birth year] Sex: male Admission Date (Current Location): 01/24/2024  [facility] and [place] Number:  Chiropodist and Address:         Provider Number: [id number]  Attending Physician Name and Address:  Caleen Qualia, MD  Relative Name and Phone Number:       Current Level of Care: Hospital Recommended Level of Care: Skilled Nursing Facility Prior Approval Number:    Date Approved/Denied:   PASRR Number:    Discharge Plan: SNF    Current Diagnoses: Patient Active Problem List   Diagnosis Date Noted   Chronic acquired lymphedema 01/25/2024   Elevated lactic acid level 01/25/2024   Obesity, Class III, BMI 40-49.9 (morbid obesity) (HCC) 01/25/2024   Diabetes mellitus without complication (HCC)    Hypertension    Depression    Hyperlipidemia    Cellulitis of left thigh 01/24/2024   Redundant skin 08/03/2012    Orientation RESPIRATION BLADDER Height & Weight     Self, Situation, Time, Place  Normal Incontinent Weight: (!) 480 lb (217.7 kg) Height:  5' 4 (162.6 cm)  BEHAVIORAL SYMPTOMS/MOOD NEUROLOGICAL BOWEL NUTRITION STATUS      Continent Diet (Regular)  AMBULATORY STATUS COMMUNICATION OF NEEDS Skin   Limited Assist Verbally Skin abrasions (Infection Scrotum, Thigh)                       Personal Care Assistance Level of Assistance  Bathing, Feeding, Dressing Bathing Assistance: Limited assistance Feeding assistance: Independent Dressing Assistance: Limited assistance     Functional Limitations Info  Sight, Speech, Hearing Sight Info: Adequate Hearing Info: Adequate Speech Info: Adequate    SPECIAL CARE FACTORS FREQUENCY  PT (By licensed PT), OT (By licensed OT)     PT Frequency: 5x/week OT Frequency: 5x/week            Contractures      Additional Factors Info  Code Status, Allergies Code Status Info: Full Allergies Info:  NKA           Current Medications (01/26/2024):  This is the current hospital active medication list Current Facility-Administered Medications  Medication Dose Route Frequency Provider Last Rate Last Admin   acetaminophen  (TYLENOL ) tablet 650 mg  650 mg Oral Q6H PRN Mansy, Jan A, MD   650 mg at 01/25/24 2133   Or   acetaminophen  (TYLENOL ) suppository 650 mg  650 mg Rectal Q6H PRN Mansy, Jan A, MD       buPROPion  (WELLBUTRIN  XL) 24 hr tablet 150 mg  150 mg Oral Daily Laurita Manor T, MD   150 mg at 01/26/24 9075   ceFEPIme  (MAXIPIME ) 2 g in sodium chloride  0.9 % 100 mL IVPB  2 g Intravenous Q8H Dail Rankin RAMAN, RPH 200 mL/hr at 01/26/24 0935 2 g at 01/26/24 0935   citalopram  (CELEXA ) tablet 40 mg  40 mg Oral Daily Laurita Manor T, MD   40 mg at 01/26/24 9075   enoxaparin  (LOVENOX ) injection 110 mg  110 mg Subcutaneous Q24H Mansy, Jan A, MD   110 mg at 01/26/24 9073   fesoterodine  (TOVIAZ ) tablet 4 mg  4 mg Oral Daily Laurita Manor T, MD   4 mg at 01/26/24 9074   furosemide  (LASIX ) injection 40 mg  40 mg Intravenous BID Laurita Manor T, MD   40 mg at 01/26/24 9075   Gerhardt's butt cream  Topical BID Laurita Cort DASEN, MD   Given at 01/26/24 9074   insulin  aspart (novoLOG ) injection 0-20 Units  0-20 Units Subcutaneous TID WC Zhang, Ping T, MD   3 Units at 01/26/24 9072   insulin  aspart (novoLOG ) injection 0-5 Units  0-5 Units Subcutaneous QHS Laurita Cort T, MD       lisinopril  (ZESTRIL ) tablet 20 mg  20 mg Oral Daily Laurita Cort T, MD   20 mg at 01/26/24 9075   magnesium  hydroxide (MILK OF MAGNESIA) suspension 30 mL  30 mL Oral Daily PRN Mansy, Jan A, MD       ondansetron  (ZOFRAN ) tablet 4 mg  4 mg Oral Q6H PRN Mansy, Jan A, MD       Or   ondansetron  (ZOFRAN ) injection 4 mg  4 mg Intravenous Q6H PRN Mansy, Jan A, MD       pravastatin  (PRAVACHOL ) tablet 40 mg  40 mg Oral q1800 Zhang, Ping T, MD   40 mg at 01/25/24 1656   traZODone  (DESYREL ) tablet 25 mg  25 mg Oral QHS PRN Mansy, Jan A, MD   25 mg at  01/25/24 2133   vancomycin  (VANCOREADY) IVPB 2000 mg/400 mL  2,000 mg Intravenous Q12H Dail Rankin RAMAN, RPH 200 mL/hr at 01/26/24 0158 2,000 mg at 01/26/24 0158     Discharge Medications: Please see discharge summary for a list of discharge medications.  Relevant Imaging Results:  Relevant Lab Results:   Additional Information SSN: [id number]  Jack Owens  [place], LCSW     "

## 2024-01-26 NOTE — Progress Notes (Signed)
 Pharmacy Antibiotic Note  Jack Owens is a 58 y.o. male admitted on 01/24/2024 with cellulitis.  Pharmacy has been consulted for Vancomycin  dosing for 7 days.  Plan: - Will deescalate Cefepime  to Ceftriaxone  2gm IV q24hper discussion with MD  Scr 0.76 >> 1.03 -Will adjust Vancomycin  to 1500 mg IV Q 12 hrs. Goal AUC 400-550. Expected AUC: 470 Cmin 14.8 SCr used: 1.03, Vd used: 0.5, BMI: 82.2  Follow up culture results to assess for antibiotic optimization. Monitor renal function to assess for any necessary antibiotic dosing changes. Pharmacy will continue to follow and will adjust abx dosing whenever warranted.  Temp (24hrs), Avg:98.1 F (36.7 C), Min:97.7 F (36.5 C), Max:98.3 F (36.8 C)   Recent Labs  Lab 01/24/24 0141 01/24/24 0142 01/24/24 0245 01/25/24 0900 01/26/24 0747  WBC 10.0  --   --   --  6.5  CREATININE 0.73  --   --  0.76 1.03  LATICACIDVEN  --  2.5* 2.5*  --   --     Estimated Creatinine Clearance: 135.6 mL/min (by C-G formula based on SCr of 1.03 mg/dL).    Allergies[1]  Antimicrobials this admission: 12/28 Ceftriaxone  >> x 1 dose 12/28 Vancomycin  >>   x 7 days 12/28 Cefepime  >> 12/30 12/30 Ceftriaxone  >>  Microbiology results: No lab cx currently ordered or pending at this time.  Thank you for allowing pharmacy to be a part of this patients care.  Allean Haas PharmD Clinical Pharmacist 01/26/2024       [1] No Known Allergies

## 2024-01-27 DIAGNOSIS — I959 Hypotension, unspecified: Secondary | ICD-10-CM | POA: Insufficient documentation

## 2024-01-27 DIAGNOSIS — Z6841 Body Mass Index (BMI) 40.0 and over, adult: Secondary | ICD-10-CM | POA: Diagnosis not present

## 2024-01-27 DIAGNOSIS — F3289 Other specified depressive episodes: Secondary | ICD-10-CM

## 2024-01-27 DIAGNOSIS — R7989 Other specified abnormal findings of blood chemistry: Secondary | ICD-10-CM | POA: Diagnosis not present

## 2024-01-27 DIAGNOSIS — E119 Type 2 diabetes mellitus without complications: Secondary | ICD-10-CM | POA: Diagnosis not present

## 2024-01-27 DIAGNOSIS — I9589 Other hypotension: Secondary | ICD-10-CM

## 2024-01-27 DIAGNOSIS — I890 Lymphedema, not elsewhere classified: Secondary | ICD-10-CM | POA: Diagnosis not present

## 2024-01-27 DIAGNOSIS — E785 Hyperlipidemia, unspecified: Secondary | ICD-10-CM | POA: Diagnosis not present

## 2024-01-27 DIAGNOSIS — L03116 Cellulitis of left lower limb: Secondary | ICD-10-CM | POA: Diagnosis not present

## 2024-01-27 LAB — GLUCOSE, CAPILLARY
Glucose-Capillary: 122 mg/dL — ABNORMAL HIGH (ref 70–99)
Glucose-Capillary: 135 mg/dL — ABNORMAL HIGH (ref 70–99)
Glucose-Capillary: 121 mg/dL — ABNORMAL HIGH (ref 70–99)
Glucose-Capillary: 150 mg/dL — ABNORMAL HIGH (ref 70–99)

## 2024-01-27 MED ORDER — SODIUM CHLORIDE 0.9 % IV BOLUS
250.0000 mL | Freq: Once | INTRAVENOUS | Status: AC
  Administered 2024-01-27: 250 mL via INTRAVENOUS

## 2024-01-27 NOTE — Progress Notes (Signed)
 " Progress Note   Patient: Jack Owens FMW:[id number] DOB: [birth year] DOA: 01/24/2024     3 DOS: the patient was seen and examined on 01/27/2024   Brief hospital course: Partly taken from H&P.   Jack Owens is a 58 y.o. male with medical history significant of morbid obesity, IIDM, HLD, HTN, chronic lymphedema, presented with worsening of left leg wound with pain, erythema and edema.  Patient also has an history of chronic lymphedema and wounds.  No fever and chills.  Came to ED to get some IV antibiotics.  On presentation stable vitals, no leukocytosis, mildly elevated lactic acid but patient was also taking metformin.  Rest of the labs at baseline.  CT scan of left thigh ordered to rule out any abscess. Started on cefepime  and vancomycin .  12/29: Vital stable, lower extremity venous Doppler negative for DVT.  Scrotal ultrasound was negative for any acute abnormality, did noted a small right epididymal dermal head cyst. CT of left thigh was suboptimal due to body habitus, did show subcutaneous thickening with no discrete loculated fluid collection.  Also shows multiple prominent left inguinal lymph nodes likely reactive.  No osseous abnormality.  Also noted to have significant left knee osteoarthritis which need to be investigated by orthopedic surgery as outpatient.  Patient is currently homeless, living in his fleeta, poor personal hygiene.  TOC was consulted to provide him with resources.  12/30: Remained hemodynamically stable with significant improvement in his thigh erythema, antibiotic switched with p.o. Augmentin  and doxycycline .  PT is recommending SNF-TOC is working on it, might be difficult disposition due to limited Medicaid beds availability. Discontinuing IV Lasix  12/31.  Blood pressure on the lower side will give a fluid bolus.  Holding antihypertensive medication.  Assessment and Plan: * Cellulitis of left thigh Cellulitis of left posterior thigh and lower abdominal  pannus. CT thigh and scrotal ultrasound was negative for any abscess or acute abnormality.  Did show superficial edema consistent with cellulitis.   Poor hygienic conditions as he was living in his fleeta and was not taking showers. Clinically now seems improving - Initially on cefepime  and vancomycin  and switched over to Augmentin  and doxycycline   Chronic acquired lymphedema Holding diuresis  Elevated lactic acid level Persistent mild lactic acidosis not responding to IV fluid.  No leukocytosis. Likely secondary to his metformin use. - Keep holding metformin   Diabetes mellitus without complication (HCC) Seems well-controlled with A1c of 6.6 and CBG within goal. -Holding home metformin -Continue with SSI  Depression -Continue home Celexa  and Wellbutrin   Hyperlipidemia Continue pravastatin   Morbid obesity with BMI of 70 and over, adult (HCC) BMI 82.39  Hypotension Fluid bolus today.  Hold lisinopril         Subjective: Patient feels okay.  Physical Exam: Vitals:   01/27/24 0759 01/27/24 0846 01/27/24 1107 01/27/24 1440  BP: (!) 94/42 (!) 97/43 (!) 110/43 (!) 135/57  Pulse: 68 68 70 82  Resp: (!) 22  19 20   Temp: (!) 97.4 F (36.3 C)  98.1 F (36.7 C) 98.3 F (36.8 C)  TempSrc:   Oral Oral  SpO2: 95%  96% 98%  Weight:      Height:       Physical Exam HENT:     Head: Normocephalic.  Eyes:     General: Lids are normal.     Conjunctiva/sclera: Conjunctivae normal.  Cardiovascular:     Rate and Rhythm: Normal rate and regular rhythm.     Heart sounds: Normal heart sounds,  S1 normal and S2 normal.  Pulmonary:     Breath sounds: No decreased breath sounds, wheezing, rhonchi or rales.  Abdominal:     Palpations: Abdomen is soft.     Tenderness: There is no abdominal tenderness.  Musculoskeletal:     Right lower leg: Swelling present.     Left lower leg: Swelling present.  Skin:    General: Skin is warm.     Comments: Posterior irritation of pannus   Neurological:     Mental Status: He is alert and oriented to person, place, and time.     Data Reviewed: Last creatinine 1.03, hemoglobin 11.2, white blood cell count 6.5   Disposition: Status is: Inpatient Remains inpatient appropriate because: Looking into rehab options  Planned Discharge Destination: Rehab    Time spent: 28 minutes  Author: Charlie Patterson, MD 01/27/2024 2:46 PM  For on call review [email or web address].  "

## 2024-01-27 NOTE — Assessment & Plan Note (Addendum)
 Seems well-controlled with A1c of 6.6 and CBG within goal. Can consider GLP-1 inhibitor as outpatient

## 2024-01-27 NOTE — Progress Notes (Signed)
 Occupational Therapy Treatment Patient Details Name: Jack Owens MRN: [id number] DOB: Aug 13, 1965 Today's Date: 01/27/2024   History of present illness [name] is a 58 y.o. male with medical history significant of morbid obesity, IIDM, HLD, HTN, chronic lymphedema, presented with worsening of left leg rash swelling pain.     Patient lives in a mobile home lives, lifestyles very much sedentary due to morbid obesity.  He has a chronic lymphedema and 3 days ago he started to feel swelling pain and rash of the backside of the thigh and pannus.  Denied any fever or chills no trouble urinating or moving his bowels.   OT comments  Patient seen for OT treatment on this date. Upon arrival to room patient resting in bed, agreeable to treatment but refusing any OOB activity stating I just got comfortable. OT provided bed level HEP (written down) with patietn able to participate and return demo HEP with min cues. Patient agreeable to perform daily to improve core stability to reduce risk of back injury and to improve overall mobility.  Patient ended treatment in bed with bed/chair alarm on and all needs within reach. Patient making good progress toward goals, will continue to follow POC. Discharge recommendation remains appropriate.        If plan is discharge home, recommend the following:  A little help with walking and/or transfers;A little help with bathing/dressing/bathroom   Equipment Recommendations  None recommended by OT    Recommendations for Other Services      Precautions / Restrictions Precautions Precautions: Fall Recall of Precautions/Restrictions: Intact Restrictions Weight Bearing Restrictions Per Provider Order: No       Mobility Bed Mobility Overal bed mobility: Modified Independent Bed Mobility:  (scooting)           General bed mobility comments: required cues (tactile and visual) for how to scoot back in bed while sitting up, he required used of hand rails  and put forth good effort    Transfers                   General transfer comment: refused     Balance                                           ADL either performed or assessed with clinical judgement   ADL Overall ADL's : At baseline                                            Extremity/Trunk Assessment              Vision       Perception     Praxis     Communication Communication Communication: No apparent difficulties   Cognition Arousal: Alert Behavior During Therapy: WFL for tasks assessed/performed                                 Following commands: Intact        Cueing   Cueing Techniques: Verbal cues  Exercises Exercises: Other exercises Other Exercises Other Exercises: UB mobility and core stabilizing HEP at bed leve: neck flexion/extension, head rotation, AAROM shoulder flexion, modifed crunches, modified oblique twist, scauplar retraction.  written HEP provided, patient agreeable to perform daily    Shoulder Instructions       General Comments      Pertinent Vitals/ Pain       Pain Assessment Pain Assessment: No/denies pain  Home Living                                          Prior Functioning/Environment              Frequency  Min 2X/week        Progress Toward Goals  OT Goals(current goals can now be found in the care plan section)  Progress towards OT goals: Progressing toward goals  Acute Rehab OT Goals Patient Stated Goal: to get stronger OT Goal Formulation: With patient Time For Goal Achievement: 02/08/24 Potential to Achieve Goals: Good ADL Goals Pt Will Perform Grooming: with modified independence;standing Pt Will Transfer to Toilet: with modified independence;ambulating;regular height toilet Pt Will Perform Toileting - Clothing Manipulation and hygiene: with modified independence;with adaptive equipment;sit to/from stand  Plan       Co-evaluation                 AM-PAC OT 6 Clicks Daily Activity     Outcome Measure   Help from another person eating meals?: None Help from another person taking care of personal grooming?: None Help from another person toileting, which includes using toliet, bedpan, or urinal?: A Little Help from another person bathing (including washing, rinsing, drying)?: A Little Help from another person to put on and taking off regular upper body clothing?: None Help from another person to put on and taking off regular lower body clothing?: A Little 6 Click Score: 21    End of Session    OT Visit Diagnosis: Muscle weakness (generalized) (M62.81);Unsteadiness on feet (R26.81)   Activity Tolerance Patient tolerated treatment well   Patient Left in bed;with call bell/phone within reach;with bed alarm set   Nurse Communication Mobility status        Time: 8679-8665 OT Time Calculation (min): 14 min  Charges: OT General Charges $OT Visit: 1 Visit OT Treatments $Therapeutic Exercise: 8-22 mins  Rogers Clause, OT/L MSOT, 01/27/2024

## 2024-01-27 NOTE — Plan of Care (Signed)
  Problem: Education: Goal: Knowledge of General Education information will improve Description: Including pain rating scale, medication(s)/side effects and non-pharmacologic comfort measures Outcome: Progressing   Problem: Health Behavior/Discharge Planning: Goal: Ability to manage health-related needs will improve Outcome: Progressing   Problem: Clinical Measurements: Goal: Ability to maintain clinical measurements within normal limits will improve Outcome: Progressing   Problem: Clinical Measurements: Goal: Will remain free from infection Outcome: Progressing   Problem: Clinical Measurements: Goal: Cardiovascular complication will be avoided Outcome: Progressing   Problem: Activity: Goal: Risk for activity intolerance will decrease Outcome: Progressing

## 2024-01-27 NOTE — Assessment & Plan Note (Signed)
 Continue pravastatin 

## 2024-01-27 NOTE — TOC Progression Note (Signed)
 Transition of Care [facility]) - Progression Note    Patient Details  Name: Jack Owens MRN: [id number] Date of Birth: Jul 20, 1965  [facility] CM/SW Contact  Lauraine Owens Carpen, LCSW Phone Number: 01/27/2024, 1:41 PM  Clinical Narrative:  Left message for [facility] to see if they will be able to offer a bed.   Expected Discharge Plan and Services                                               Social Drivers of Health (SDOH) Interventions SDOH Screenings   Food Insecurity: Food Insecurity Present (01/24/2024)  Housing: High Risk (01/24/2024)  Transportation Needs: No Transportation Needs (01/24/2024)  Utilities: Not At Risk (01/24/2024)  Tobacco Use: Low Risk (01/24/2024)    Readmission Risk Interventions     No data to display

## 2024-01-27 NOTE — Plan of Care (Signed)

## 2024-01-27 NOTE — Assessment & Plan Note (Signed)
-   Continue home Celexa and Wellbutrin

## 2024-01-27 NOTE — Progress Notes (Signed)
 Physical Therapy Treatment Patient Details Name: Jack Owens MRN: [id number] DOB: 09-10-1965 Today's Date: 01/27/2024   History of Present Illness STEFFON Owens is a 58 y.o. male with medical history significant of morbid obesity, IIDM, HLD, HTN, chronic lymphedema, presented with worsening of left leg rash swelling pain.     Patient lives in a mobile home lives, lifestyles very much sedentary due to morbid obesity.  He has a chronic lymphedema and 3 days ago he started to feel swelling pain and rash of the backside of the thigh and pannus.  Denied any fever or chills no trouble urinating or moving his bowels.    PT Comments  Patient seen for PT session focused on OOB mobility able to walk 25' feet in room. Patient required supervision and used RW. Tolerated session well with no signs of exertion or distress. Vitals remained stable during activity. Interventions aimed at improving OOB mobility. Patient shows good potential to make progress with continued acute level rehab. Patient continues to demonstrate mild activity restrictions and poor tolerance for progressive mobility. Continued skilled PT recommended to progress toward functional goals and support discharge readiness. Pt making good progress toward goals, will continue to follow POC. Discharge recommendation remains appropriate     If plan is discharge home, recommend the following: A little help with walking and/or transfers;A little help with bathing/dressing/bathroom   Can travel by private vehicle     No  Equipment Recommendations  Rolling walker (2 wheels)    Recommendations for Other Services       Precautions / Restrictions Precautions Precautions: Fall Recall of Precautions/Restrictions: Intact Restrictions Weight Bearing Restrictions Per Provider Order: No     Mobility  Bed Mobility Overal bed mobility: Modified Independent Bed Mobility: Supine to Sit     Supine to sit: Used rails, HOB elevated Sit to supine:  Min assist, Used rails, HOB elevated   General bed mobility comments: no outside assit needed    Transfers Overall transfer level: Needs assistance Equipment used: None Transfers: Sit to/from Stand Sit to Stand: Supervision, Modified independent (Device/Increase time)   Step pivot transfers: Contact guard assist            Ambulation/Gait Ambulation/Gait assistance: Supervision Gait Distance (Feet): 25 Feet Assistive device: None, Rolling walker (2 wheels) Gait Pattern/deviations: Wide base of support, Trendelenburg Gait velocity: decreased         Stairs             Wheelchair Mobility     Tilt Bed    Modified Rankin (Stroke Patients Only)       Balance Overall balance assessment: Needs assistance Sitting-balance support: Feet supported, No upper extremity supported Sitting balance-Leahy Scale: Good     Standing balance support: During functional activity Standing balance-Leahy Scale: Fair                              Hotel Manager: No apparent difficulties  Cognition Arousal: Alert Behavior During Therapy: WFL for tasks assessed/performed   PT - Cognitive impairments: No apparent impairments                         Following commands: Intact      Cueing Cueing Techniques: Verbal cues  Exercises      General Comments        Pertinent Vitals/Pain Pain Assessment Pain Assessment: No/denies pain    Home Living Family/patient  expects to be discharged to:: Shelter/Homeless                   Additional Comments: patient lives in a [place]    Prior Function            PT Goals (current goals can now be found in the care plan section) Acute Rehab PT Goals Patient Stated Goal: unstated PT Goal Formulation: With patient    Frequency    Min 2X/week      PT Plan      Co-evaluation              AM-PAC PT 6 Clicks Mobility   Outcome Measure  Help needed  turning from your back to your side while in a flat bed without using bedrails?: A Little Help needed moving from lying on your back to sitting on the side of a flat bed without using bedrails?: A Little Help needed moving to and from a bed to a chair (including a wheelchair)?: None Help needed standing up from a chair using your arms (e.g., wheelchair or bedside chair)?: None Help needed to walk in hospital room?: A Little Help needed climbing 3-5 steps with a railing? : A Lot 6 Click Score: 19    End of Session   Activity Tolerance: Patient tolerated treatment well Patient left: in chair;with nursing/sitter in room Nurse Communication: Mobility status PT Visit Diagnosis: Other abnormalities of gait and mobility (R26.89)     Time: 9097-9076 PT Time Calculation (min) (ACUTE ONLY): 21 min  Charges:    $Therapeutic Activity: 8-22 mins PT General Charges $$ ACUTE PT VISIT: 1 Visit                     Sherlean Lesches DPT, PT     Sherlean A Chitara Clonch 01/27/2024, 9:28 AM

## 2024-01-27 NOTE — Assessment & Plan Note (Signed)
 Resolved

## 2024-01-27 NOTE — Assessment & Plan Note (Signed)
 BMI 82.39

## 2024-01-28 DIAGNOSIS — F3289 Other specified depressive episodes: Secondary | ICD-10-CM | POA: Diagnosis not present

## 2024-01-28 DIAGNOSIS — E785 Hyperlipidemia, unspecified: Secondary | ICD-10-CM | POA: Diagnosis not present

## 2024-01-28 DIAGNOSIS — L03116 Cellulitis of left lower limb: Secondary | ICD-10-CM | POA: Diagnosis not present

## 2024-01-28 DIAGNOSIS — E7849 Other hyperlipidemia: Secondary | ICD-10-CM | POA: Diagnosis not present

## 2024-01-28 DIAGNOSIS — I959 Hypotension, unspecified: Secondary | ICD-10-CM | POA: Diagnosis not present

## 2024-01-28 DIAGNOSIS — E119 Type 2 diabetes mellitus without complications: Secondary | ICD-10-CM | POA: Diagnosis not present

## 2024-01-28 DIAGNOSIS — I890 Lymphedema, not elsewhere classified: Secondary | ICD-10-CM | POA: Diagnosis not present

## 2024-01-28 DIAGNOSIS — I9589 Other hypotension: Secondary | ICD-10-CM | POA: Diagnosis not present

## 2024-01-28 DIAGNOSIS — R7989 Other specified abnormal findings of blood chemistry: Secondary | ICD-10-CM | POA: Diagnosis not present

## 2024-01-28 DIAGNOSIS — Z6841 Body Mass Index (BMI) 40.0 and over, adult: Secondary | ICD-10-CM | POA: Diagnosis not present

## 2024-01-28 LAB — GLUCOSE, CAPILLARY
Glucose-Capillary: 121 mg/dL — ABNORMAL HIGH (ref 70–99)
Glucose-Capillary: 152 mg/dL — ABNORMAL HIGH (ref 70–99)
Glucose-Capillary: 161 mg/dL — ABNORMAL HIGH (ref 70–99)
Glucose-Capillary: 171 mg/dL — ABNORMAL HIGH (ref 70–99)

## 2024-01-28 MED ORDER — FUROSEMIDE 40 MG PO TABS
40.0000 mg | ORAL_TABLET | Freq: Every day | ORAL | Status: DC
  Administered 2024-01-29 – 2024-02-02 (×5): 40 mg via ORAL
  Filled 2024-01-28 (×5): qty 1

## 2024-01-28 NOTE — Progress Notes (Signed)
 Mobility Specialist Progress Note:    01/28/24 1037  Mobility  Activity  (heel slides, arm raises)  Level of Assistance Independent  Assistive Device None  Activity Response Tolerated well  Mobility visit 1 Mobility  Mobility Specialist Start Time (ACUTE ONLY) 1026  Mobility Specialist Stop Time (ACUTE ONLY) 1037  Mobility Specialist Time Calculation (min) (ACUTE ONLY) 11 min   Pt received in bed, not agreeable to ambulation or any OOB mobility this date. Pt agreeable to bed exercises including heel slides and arm raises and also states he performs hip ROM in bed throughout the day as well. Left pt supine, all needs met.  Sherrilee Ditty Mobility Specialist Please contact via Special Educational Needs Teacher or  Rehab office at [phone]

## 2024-01-28 NOTE — Plan of Care (Signed)

## 2024-01-28 NOTE — Progress Notes (Signed)
 " Progress Note   Patient: Jack Owens FMW:[id number] DOB: 1965-12-06 DOA: 01/24/2024     4 DOS: the patient was seen and examined on 01/28/2024   Brief hospital course: Partly taken from H&P.   [name] is a 59 y.o. male with medical history significant of morbid obesity, IIDM, HLD, HTN, chronic lymphedema, presented with worsening of left leg wound with pain, erythema and edema.  Patient also has an history of chronic lymphedema and wounds.  No fever and chills.  Came to ED to get some IV antibiotics.  On presentation stable vitals, no leukocytosis, mildly elevated lactic acid but patient was also taking metformin.  Rest of the labs at baseline.  CT scan of left thigh ordered to rule out any abscess. Started on cefepime  and vancomycin .  12/29: Vital stable, lower extremity venous Doppler negative for DVT.  Scrotal ultrasound was negative for any acute abnormality, did noted a small right epididymal dermal head cyst. CT of left thigh was suboptimal due to body habitus, did show subcutaneous thickening with no discrete loculated fluid collection.  Also shows multiple prominent left inguinal lymph nodes likely reactive.  No osseous abnormality.  Also noted to have significant left knee osteoarthritis which need to be investigated by orthopedic surgery as outpatient.  Patient is currently homeless, living in his [place], poor personal hygiene.  TOC was consulted to provide him with resources.  12/30: Remained hemodynamically stable with significant improvement in his thigh erythema, antibiotic switched with p.o. Augmentin  and doxycycline .  PT is recommending SNF-TOC is working on it, might be difficult disposition due to limited Medicaid beds availability. Discontinuing IV Lasix  12/31.  Blood pressure on the lower side will give a fluid bolus.  Holding antihypertensive medication.  Assessment and Plan: * Cellulitis of left thigh Cellulitis of left posterior thigh and lower abdominal  pannus. CT thigh and scrotal ultrasound was negative for any abscess or acute abnormality.  Did show superficial edema consistent with cellulitis.   Poor hygienic conditions as he was living in his [place] and was not taking showers. Clinically now seems improving - Initially on cefepime  and vancomycin  and switched over to Augmentin  and doxycycline  for total of 7 days.  Chronic acquired lymphedema Restart oral Lasix  tomorrow  Elevated lactic acid level - Keep holding metformin   Diabetes mellitus without complication (HCC) Seems well-controlled with A1c of 6.6 and CBG within goal. -Holding home metformin -Continue with SSI  Depression -Continue home Celexa  and Wellbutrin   Hyperlipidemia Continue pravastatin   Morbid obesity with BMI of 70 and over, adult (HCC) BMI 82.39  Hypotension Resolved.  Restart Lasix  tomorrow.        Subjective: Patient feels okay.  Blood pressure up today.  Encouraged to work with mobility specialist and physical therapy team.  Admitted with cellulitis.  Physical Exam: Vitals:   01/27/24 1832 01/27/24 2042 01/28/24 0455 01/28/24 0738  BP: (!) 141/55 131/62 (!) 129/58 (!) 127/57  Pulse: 78 78 76 87  Resp: 18 16 18 18   Temp: 98.6 F (37 C) 97.8 F (36.6 C) 98.1 F (36.7 C) 98.5 F (36.9 C)  TempSrc:  Oral Oral   SpO2: 94% 96% 95% 94%  Weight:      Height:       Physical Exam HENT:     Head: Normocephalic.  Eyes:     General: Lids are normal.     Conjunctiva/sclera: Conjunctivae normal.  Cardiovascular:     Rate and Rhythm: Normal rate and regular rhythm.  Heart sounds: Normal heart sounds, S1 normal and S2 normal.  Pulmonary:     Breath sounds: No decreased breath sounds, wheezing, rhonchi or rales.  Abdominal:     Palpations: Abdomen is soft.     Tenderness: There is no abdominal tenderness.  Musculoskeletal:     Right lower leg: Swelling present.     Left lower leg: Swelling present.  Skin:    General: Skin is warm.      Comments: Posterior irritation of pannus  Neurological:     Mental Status: He is alert and oriented to person, place, and time.     Data Reviewed: No new data  Family Communication: Spoke with daughter today  Disposition: Status is: Inpatient Remains inpatient appropriate because: Looking into rehab beds.  Family is interested in assisted living afterwards.  Planned Discharge Destination: Rehab    Time spent: 28 minutes  Author: Charlie Patterson, MD 01/28/2024 3:00 PM  For on call review [email or web address].  "

## 2024-01-28 NOTE — Plan of Care (Signed)

## 2024-01-28 NOTE — Plan of Care (Signed)
  Problem: Clinical Measurements: Goal: Ability to maintain clinical measurements within normal limits will improve Outcome: Progressing   Problem: Nutrition: Goal: Adequate nutrition will be maintained Outcome: Progressing   Problem: Coping: Goal: Level of anxiety will decrease Outcome: Progressing   

## 2024-01-29 DIAGNOSIS — E7849 Other hyperlipidemia: Secondary | ICD-10-CM

## 2024-01-29 DIAGNOSIS — R7989 Other specified abnormal findings of blood chemistry: Secondary | ICD-10-CM | POA: Diagnosis not present

## 2024-01-29 DIAGNOSIS — I890 Lymphedema, not elsewhere classified: Secondary | ICD-10-CM | POA: Diagnosis not present

## 2024-01-29 DIAGNOSIS — L03116 Cellulitis of left lower limb: Secondary | ICD-10-CM | POA: Diagnosis not present

## 2024-01-29 DIAGNOSIS — F3289 Other specified depressive episodes: Secondary | ICD-10-CM | POA: Diagnosis not present

## 2024-01-29 DIAGNOSIS — L8990 Pressure ulcer of unspecified site, unspecified stage: Secondary | ICD-10-CM | POA: Insufficient documentation

## 2024-01-29 LAB — CBC
HCT: 36.8 % — ABNORMAL LOW (ref 39.0–52.0)
Hemoglobin: 11.3 g/dL — ABNORMAL LOW (ref 13.0–17.0)
MCH: 27.6 pg (ref 26.0–34.0)
MCHC: 30.7 g/dL (ref 30.0–36.0)
MCV: 89.8 fL (ref 80.0–100.0)
Platelets: 388 K/uL (ref 150–400)
RBC: 4.1 MIL/uL — ABNORMAL LOW (ref 4.22–5.81)
RDW: 14.1 % (ref 11.5–15.5)
WBC: 6.5 K/uL (ref 4.0–10.5)
nRBC: 0 % (ref 0.0–0.2)

## 2024-01-29 LAB — BASIC METABOLIC PANEL WITH GFR
Anion gap: 9 (ref 5–15)
BUN: 21 mg/dL — ABNORMAL HIGH (ref 6–20)
CO2: 28 mmol/L (ref 22–32)
Calcium: 9.4 mg/dL (ref 8.9–10.3)
Chloride: 100 mmol/L (ref 98–111)
Creatinine, Ser: 0.76 mg/dL (ref 0.61–1.24)
GFR, Estimated: >60 mL/min
Glucose, Bld: 121 mg/dL — ABNORMAL HIGH (ref 70–99)
Potassium: 4.9 mmol/L (ref 3.5–5.1)
Sodium: 137 mmol/L (ref 135–145)

## 2024-01-29 LAB — LACTIC ACID, PLASMA: Lactic Acid, Venous: 1.4 mmol/L (ref 0.5–1.9)

## 2024-01-29 LAB — GLUCOSE, CAPILLARY
Glucose-Capillary: 127 mg/dL — ABNORMAL HIGH (ref 70–99)
Glucose-Capillary: 138 mg/dL — ABNORMAL HIGH (ref 70–99)
Glucose-Capillary: 123 mg/dL — ABNORMAL HIGH (ref 70–99)
Glucose-Capillary: 142 mg/dL — ABNORMAL HIGH (ref 70–99)

## 2024-01-29 NOTE — Assessment & Plan Note (Addendum)
 Pressure ulcer present on admission and on dependent skin fold.  Present on admission.    1.Cleanse L posterior thigh/scrotal wounds with Vashe, do not rinse. Apply Aquacel Soila # [id number]) to wound beds daily and secure with silicone foam or ABD pad and clothe tape whichever is preferred. 2. Cleanse underneath pannus/abdominal folds with soap and water, dry and apply Interdry AG as follows: Order Gerlean # [phone] Measure and cut length of InterDry to fit in skin folds that have skin breakdown Tuck InterDry fabric into skin folds in a single layer, allow for 2 inches of overhang from skin edges to allow for wicking to occur May remove to bathe; dry area thoroughly and then tuck into affected areas again Do not apply any creams or ointments when using InterDry DO NOT THROW AWAY FOR 5 DAYS unless soiled with stool DO NOT [facility] product, this will inactivate the silver in the material New sheet of Interdry should be applied after 5 days of use if patient continues to have skin breakdown   Unable to set up home health secondary to patient living in a [place].  Patient states that his medical clinic has a person that can follow his wound so we may have to follow-up there 3 times a week.  Will also refer to the [facility] wound care clinic.

## 2024-01-29 NOTE — Plan of Care (Signed)

## 2024-01-29 NOTE — Consult Note (Addendum)
 WOC Nurse Consult Note: Reason for Consult: Consult requested for wounds in skin folds.  This was already performed on 12/28; refer to [facility] consult notes.  Performed remotely after review of progress notes and photos in the EMR.  Wounds remain red and moist with drainage. I will change from Xeroform gauze to Aquacel to absorb drainage and provide antimicrobial benefits.  Continue use of Interdry to wick moisture away from skin and provide antimicrobial benefits.   Topical treatment orders provided for bedside nurses to perform as follows:  Wound type:Cleanse L posterior thigh/scrotal wounds with Vashe, do not rinse. Apply Aquacel Soila # [phone]) to wound beds daily and secure with silicone foam or ABD pad and clothe tape whichever is preferred. Interdry use instructions Soila [phone]) have already been provided for nurses' use.   Please re-consult if further assistance is needed.  Thank-you,  Stephane Fought MSN, RN, CWOCN, CWCN-AP, CNS Contact Mon-Fri 0700-1500: [phone]

## 2024-01-29 NOTE — Progress Notes (Signed)
 PT Cancellation Note  Patient Details Name: Jack Owens MRN: [id number] DOB: Jun 09, 1965   Cancelled Treatment:    Reason Eval/Treat Not Completed: Other (comment)  Offered and encouraged x 3.  Declined each attempt.  Pt did get up and walk with OT earlier.  Stated he will get up again with nursing later in the day.  Will return at a later time/date.   Lauraine Gills 01/29/2024, 12:31 PM

## 2024-01-29 NOTE — Progress Notes (Signed)
 " Progress Note   Patient: Jack Owens FMW:[id number] DOB: 1965/10/25 DOA: 01/24/2024     5 DOS: the patient was seen and examined on 01/29/2024   Brief hospital course: Partly taken from H&P.   Jack Owens is a 59 y.o. male with medical history significant of morbid obesity, IIDM, HLD, HTN, chronic lymphedema, presented with worsening of left leg wound with pain, erythema and edema.  Patient also has an history of chronic lymphedema and wounds.  No fever and chills.  Came to ED to get some IV antibiotics.  On presentation stable vitals, no leukocytosis, mildly elevated lactic acid but patient was also taking metformin.  Rest of the labs at baseline.  CT scan of left thigh ordered to rule out any abscess. Started on cefepime  and vancomycin .  12/29: Vital stable, lower extremity venous Doppler negative for DVT.  Scrotal ultrasound was negative for any acute abnormality, did noted a small right epididymal dermal head cyst. CT of left thigh was suboptimal due to body habitus, did show subcutaneous thickening with no discrete loculated fluid collection.  Also shows multiple prominent left inguinal lymph nodes likely reactive.  No osseous abnormality.  Also noted to have significant left knee osteoarthritis which need to be investigated by orthopedic surgery as outpatient.  Patient is currently homeless, living in his [place], poor personal hygiene.  TOC was consulted to provide him with resources.  12/30: Remained hemodynamically stable with significant improvement in his thigh erythema, antibiotic switched with p.o. Augmentin  and doxycycline .  PT is recommending SNF-TOC is working on it, might be difficult disposition due to limited Medicaid beds availability. Discontinuing IV Lasix  12/31.  Blood pressure on the lower side will give a fluid bolus.  Holding antihypertensive medication. 01/28/2024.  Still waiting rehab referrals 1/2.  TOC expanding bed search for rehab.  Assessment and Plan: *  Cellulitis of left thigh Cellulitis of left posterior thigh and lower abdominal pannus. CT thigh and scrotal ultrasound was negative for any abscess or acute abnormality.  Did show superficial edema consistent with cellulitis.   Poor hygienic conditions as he was living in his [place] and was not taking showers. Clinically now seems improving - Initially on cefepime  and vancomycin  and switched over to Augmentin  and doxycycline  for total of 7 days. - Patient has a wound on a dependent pannus.  Will reconsult wound care team.  Chronic acquired lymphedema Continue Lasix .  Elevated lactic acid level -Keep holding metformin   Diabetes mellitus without complication (HCC) Seems well-controlled with A1c of 6.6 and CBG within goal. -Holding home metformin -Continue with SSI  Depression -Continue home Celexa  and Wellbutrin   Hyperlipidemia Continue pravastatin   Pressure ulcer Pressure ulcer present on admission and on dependent skin fold.  Present on admission.  Will reconsult wound care team.  Morbid obesity with BMI of 70 and over, adult (HCC) BMI 82.39  Hypotension Resolved.          Subjective: Patient feeling okay.  Patient states that he [place] on his friend's property.  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  Physical Exam: Vitals:   01/28/24 1620 01/28/24 1944 01/29/24 0433 01/29/24 0855  BP: (!) 125/49 128/60 (!) 132/59 136/60  Pulse: 79 79 67 73  Resp: 16 17 18 16   Temp: 98.9 F (37.2 C) 98.6 F (37 C) 97.6 F (36.4 C) 98.3 F (36.8 C)  TempSrc:  Oral Oral Oral  SpO2: 95% 97% 94% 95%  Weight:      Height:  Physical Exam HENT:     Head: Normocephalic.  Eyes:     General: Lids are normal.     Conjunctiva/sclera: Conjunctivae normal.  Cardiovascular:     Rate and Rhythm: Normal rate and regular rhythm.     Heart sounds: Normal heart sounds, S1 normal and S2 normal.  Pulmonary:     Breath sounds: No decreased breath sounds, wheezing, rhonchi or  rales.  Abdominal:     Palpations: Abdomen is soft.     Tenderness: There is no abdominal tenderness.  Musculoskeletal:     Right lower leg: Swelling present.     Left lower leg: Swelling present.  Skin:    General: Skin is warm.     Comments: Posterior irritation of pannus  Neurological:     Mental Status: He is alert and oriented to person, place, and time.     Data Reviewed: Creatinine 0.76, hemoglobin 11.3  Family Communication: Tried to reach daughter  Disposition: Status is: Inpatient Remains inpatient appropriate because: TOC expanding bed search for rehabs.  Encouraged moving around with physical therapy.  Planned Discharge Destination: Rehab    Time spent: 28 minutes  Author: Charlie Patterson, MD 01/29/2024 2:08 PM  For on call review [email or web address].  "

## 2024-01-29 NOTE — Progress Notes (Signed)
 Occupational Therapy Treatment Patient Details Name: LEJON AFZAL MRN: [id number] DOB: April 23, 1965 Today's Date: 01/29/2024   History of present illness SHIHAB STATES is a 59 y.o. male with medical history significant of morbid obesity, IIDM, HLD, HTN, chronic lymphedema, presented with worsening of left leg rash swelling pain.     Patient lives in a mobile home lives, lifestyles very much sedentary due to morbid obesity.  He has a chronic lymphedema and 3 days ago he started to feel swelling pain and rash of the backside of the thigh and pannus.  Denied any fever or chills no trouble urinating or moving his bowels.   OT comments  Mr. Peggs was seen for OT treatment on this date. Upon arrival to room pt supine in bed, agreeable to OT Tx session, stating I'll walk to the door and back. OT facilitated ADL management with education and assistance as described below. See ADL section for additional details regarding occupational performance. Pt continues to be functionally limited by decreased LB access, generalized weakness, and poor activity tolerance. Pt return verbalizes understanding of education provided t/o session. Pt is progressing toward OT goals and continues to benefit from skilled OT services to maximize return to PLOF and minimize risk of future falls, injury, and readmission. Will continue to follow POC as written. Discharge recommendation remains appropriate.        If plan is discharge home, recommend the following:  A little help with walking and/or transfers;A little help with bathing/dressing/bathroom   Equipment Recommendations  None recommended by OT    Recommendations for Other Services      Precautions / Restrictions Precautions Precautions: Fall Recall of Precautions/Restrictions: Intact Restrictions Weight Bearing Restrictions Per Provider Order: No       Mobility Bed Mobility Overal bed mobility: Needs Assistance       Supine to sit: HOB elevated, Used rails,  Modified independent (Device/Increase time) Sit to supine: Mod assist, Used rails   General bed mobility comments: MOD A to bring feet up over EOB during sit>supine. Educated on log roll technique for improved functional independence/skin integrity, however, pt declines to trial this date.    Transfers Overall transfer level: Needs assistance Equipment used: Rolling walker (2 wheels)   Sit to Stand: Contact guard assist     Step pivot transfers: Contact guard assist     General transfer comment: CGA for safety. No physical assist required to walk to room door and back to bed with RW.     Balance Overall balance assessment: Needs assistance Sitting-balance support: Feet supported, No upper extremity supported Sitting balance-Leahy Scale: Good     Standing balance support: During functional activity, Bilateral upper extremity supported, Reliant on assistive device for balance Standing balance-Leahy Scale: Fair                             ADL either performed or assessed with clinical judgement   ADL Overall ADL's : Needs assistance/impaired                                       General ADL Comments: MAX A for LB ADL management from STS 2/2 poor LB access, CGA for STS t/fs and to perform brief bout of functional mobility in room. MOD A to return to supine. Pt educated on compensatory ADL management to maximize safety, skin integrity, and  functional independence.    Extremity/Trunk Assessment              Vision       [employer] Communication: No apparent difficulties   Cognition Arousal: Alert Behavior During Therapy: WFL for tasks assessed/performed Cognition: No apparent impairments                               Following commands: Intact        Cueing   Cueing Techniques: Verbal cues  Exercises Other Exercises Other Exercises: OT facilitated ADL management with  education and assist as described above.    Shoulder Instructions       General Comments      Pertinent Vitals/ Pain       Pain Assessment Pain Assessment: No/denies pain Pain Intervention(s): Monitored during session  Home Living                                          Prior Functioning/Environment              Frequency  Min 2X/week        Progress Toward Goals  OT Goals(current goals can now be found in the care plan section)  Progress towards OT goals: Progressing toward goals  Acute Rehab OT Goals Patient Stated Goal: to get stronger OT Goal Formulation: With patient Time For Goal Achievement: 02/08/24 Potential to Achieve Goals: Good  Plan      Co-evaluation                 AM-PAC OT 6 Clicks Daily Activity     Outcome Measure   Help from another person eating meals?: None Help from another person taking care of personal grooming?: None Help from another person toileting, which includes using toliet, bedpan, or urinal?: A Little Help from another person bathing (including washing, rinsing, drying)?: A Little Help from another person to put on and taking off regular upper body clothing?: None Help from another person to put on and taking off regular lower body clothing?: A Little 6 Click Score: 21    End of Session Equipment Utilized During Treatment: Gait belt;Rolling walker (2 wheels)  OT Visit Diagnosis: Muscle weakness (generalized) (M62.81);Unsteadiness on feet (R26.81)   Activity Tolerance Patient tolerated treatment well   Patient Left in bed;with call bell/phone within reach;with bed alarm set   Nurse Communication          Time: 9064-9051 OT Time Calculation (min): 13 min  Charges: OT General Charges $OT Visit: 1 Visit OT Treatments $Self Care/Home Management : 8-22 mins  Jhonny Pelton, M.S., OTR/L 01/29/2024, 12:42 PM

## 2024-01-30 DIAGNOSIS — L03116 Cellulitis of left lower limb: Secondary | ICD-10-CM | POA: Diagnosis not present

## 2024-01-30 DIAGNOSIS — I890 Lymphedema, not elsewhere classified: Secondary | ICD-10-CM | POA: Diagnosis not present

## 2024-01-30 DIAGNOSIS — E119 Type 2 diabetes mellitus without complications: Secondary | ICD-10-CM | POA: Diagnosis not present

## 2024-01-30 DIAGNOSIS — R7989 Other specified abnormal findings of blood chemistry: Secondary | ICD-10-CM | POA: Diagnosis not present

## 2024-01-30 LAB — GLUCOSE, CAPILLARY
Glucose-Capillary: 122 mg/dL — ABNORMAL HIGH (ref 70–99)
Glucose-Capillary: 162 mg/dL — ABNORMAL HIGH (ref 70–99)
Glucose-Capillary: 130 mg/dL — ABNORMAL HIGH (ref 70–99)
Glucose-Capillary: 146 mg/dL — ABNORMAL HIGH (ref 70–99)

## 2024-01-30 NOTE — Plan of Care (Signed)
" °  Problem: Education: Goal: Knowledge of General Education information will improve Description: Including pain rating scale, medication(s)/side effects and non-pharmacologic comfort measures Outcome: Progressing   Problem: Health Behavior/Discharge Planning: Goal: Ability to manage health-related needs will improve Outcome: Progressing   Problem: Activity: Goal: Risk for activity intolerance will decrease Outcome: Progressing   Problem: Nutrition: Goal: Adequate nutrition will be maintained Outcome: Progressing   Problem: Coping: Goal: Level of anxiety will decrease Outcome: Progressing   Problem: Elimination: Goal: Will not experience complications related to bowel motility Outcome: Progressing   Problem: Safety: Goal: Ability to remain free from injury will improve Outcome: Progressing   Problem: Skin Integrity: Goal: Risk for impaired skin integrity will decrease Outcome: Progressing   Problem: Education: Goal: Ability to describe self-care measures that may prevent or decrease complications (Diabetes Survival Skills Education) will improve Outcome: Progressing   Problem: Health Behavior/Discharge Planning: Goal: Ability to identify and utilize available resources and services will improve Outcome: Progressing   "

## 2024-01-30 NOTE — Progress Notes (Signed)
 Physical Therapy Treatment Patient Details Name: Jack Owens MRN: [id number] DOB: 03/29/1965 Today's Date: 01/30/2024   History of Present Illness Jack Owens is a 59 y.o. male with medical history significant of morbid obesity, IIDM, HLD, HTN, chronic lymphedema, presented with worsening of left leg rash swelling pain.     Patient lives in a mobile home lives, lifestyles very much sedentary due to morbid obesity.  He has a chronic lymphedema and 3 days ago he started to feel swelling pain and rash of the backside of the thigh and pannus.  Denied any fever or chills no trouble urinating or moving his bowels.    PT Comments  Patient found supine in bed with patient pleasant and cooperative. Patient denies pain but states sharp pain in lower abdominal region upon standing but denies pain when ambulating. Communicated with patient to ensure to communicate with MD about pain with patient verbalizing understanding and intent to comply. Patient performing overall bed mobility with min A with HOB elevated requiring usage of bed rails and hand held assist. Patient also performing sts from EOB at modified [place]. Patient guided through ambulation with rolling walker completing 48 feet at [facility] for safety. Patient maintaining effective reciprocal stride length with wide base of support and forward trunk leaning while maintaining BOS. Patient presenting with no distress or SOB upon completion. Patient left seated in recliner with call bell and remote within reach.     If plan is discharge home, recommend the following: A little help with walking and/or transfers;A little help with bathing/dressing/bathroom   Can travel by private vehicle     No  Equipment Recommendations  Rolling walker (2 wheels)    Recommendations for Other Services       Precautions / Restrictions Precautions Precautions: Fall Recall of Precautions/Restrictions: Intact Restrictions Weight Bearing Restrictions Per Provider  Order: No     Mobility  Bed Mobility Overal bed mobility: Needs Assistance Bed Mobility: Supine to Sit     Supine to sit: HOB elevated, Used rails, Modified independent (Device/Increase time) Sit to supine: Modified independent (Device/Increase time)   General bed mobility comments: Patient requiring min a using hand held assist for sidelying to sitting EOB with HOB elevated and heavy usage of bed rail Patient Response: Cooperative  Transfers Overall transfer level: Needs assistance Equipment used: Rolling walker (2 wheels) Transfers: Sit to/from Stand Sit to Stand: Modified independent (Device/Increase time)   Step pivot transfers: Modified independent (Device/Increase time)            Ambulation/Gait Ambulation/Gait assistance: Contact guard assist Gait Distance (Feet): 48 Feet Assistive device: Rolling walker (2 wheels) Gait Pattern/deviations: Trunk flexed, Wide base of support       General Gait Details:  (Able to ambulate outside of room back to recliner in room at [facility] with patient indicating no LOB)      Tilt Bed Tilt Bed Patient Response: Cooperative      Balance Overall balance assessment: Needs assistance                                          Communication Communication Communication: No apparent difficulties  Cognition Arousal: Alert Behavior During Therapy: WFL for tasks assessed/performed   PT - Cognitive impairments: No apparent impairments  Following commands: Intact      Cueing Cueing Techniques: Verbal cues         Pertinent Vitals/Pain Pain Assessment Pain Assessment: No/denies pain Pain Intervention(s): Limited activity within patient's tolerance           PT Goals (current goals can now be found in the care plan section) Acute Rehab PT Goals Patient Stated Goal: unstated PT Goal Formulation: With patient Progress towards PT goals: Progressing toward goals     Frequency    Min 2X/week       AM-PAC PT 6 Clicks Mobility   Outcome Measure  Help needed turning from your back to your side while in a flat bed without using bedrails?: A Little Help needed moving from lying on your back to sitting on the side of a flat bed without using bedrails?: A Little Help needed moving to and from a bed to a chair (including a wheelchair)?: None Help needed standing up from a chair using your arms (e.g., wheelchair or bedside chair)?: None Help needed to walk in hospital room?: A Little Help needed climbing 3-5 steps with a railing? : A Lot 6 Click Score: 19    End of Session   Activity Tolerance: Patient tolerated treatment well Patient left: in chair;with call bell/phone within reach Nurse Communication: Mobility status PT Visit Diagnosis: Other abnormalities of gait and mobility (R26.89)     Time: 8896-8886 PT Time Calculation (min) (ACUTE ONLY): 10 min  Charges:    $Gait Training: 8-22 mins PT General Charges $$ ACUTE PT VISIT: 1 Visit                     Waddell Lesches, PTA

## 2024-01-30 NOTE — Progress Notes (Signed)
 " Progress Note   Patient: Jack Owens FMW:[id number] DOB: 1965/03/31 DOA: 01/24/2024     6 DOS: the patient was seen and examined on 01/30/2024   Brief hospital course: Partly taken from H&P.   Jack Owens is a 59 y.o. male with medical history significant of morbid obesity, IIDM, HLD, HTN, chronic lymphedema, presented with worsening of left leg wound with pain, erythema and edema.  Patient also has an history of chronic lymphedema and wounds.  No fever and chills.  Came to ED to get some IV antibiotics.  On presentation stable vitals, no leukocytosis, mildly elevated lactic acid but patient was also taking metformin.  Rest of the labs at baseline.  CT scan of left thigh ordered to rule out any abscess. Started on cefepime  and vancomycin .  12/29: Vital stable, lower extremity venous Doppler negative for DVT.  Scrotal ultrasound was negative for any acute abnormality, did noted a small right epididymal dermal head cyst. CT of left thigh was suboptimal due to body habitus, did show subcutaneous thickening with no discrete loculated fluid collection.  Also shows multiple prominent left inguinal lymph nodes likely reactive.  No osseous abnormality.  Also noted to have significant left knee osteoarthritis which need to be investigated by orthopedic surgery as outpatient.  Patient is currently homeless, [place], poor personal hygiene.  TOC was consulted to provide him with resources.  12/30: Remained hemodynamically stable with significant improvement in his thigh erythema, antibiotic switched with p.o. Augmentin  and doxycycline .  PT is recommending SNF-TOC is working on it, might be difficult disposition due to limited Medicaid beds availability. Discontinuing IV Lasix  12/31.  Blood pressure on the lower side will give a fluid bolus.  Holding antihypertensive medication. 01/28/2024.  Still waiting rehab referrals 1/2.  TOC expanding bed search for rehab. 1/3.  Patient thinking about  going home and not waiting for rehab bed.  [place] we are unable to set up home health to take care of this wound.  He will let me know on how he wants to proceed.  Assessment and Plan: * Cellulitis of left thigh Cellulitis of left posterior thigh and lower abdominal pannus. CT thigh and scrotal ultrasound was negative for any abscess or acute abnormality.  Did show superficial edema consistent with cellulitis.   Poor hygienic conditions as he was [place] and was not taking showers. Clinically now seems improving - Initially on cefepime  and vancomycin  and switched over to Augmentin  and doxycycline  for total of 7 days. - Patient has a wound on a dependent pannus.   Chronic acquired lymphedema Continue Lasix .  Elevated lactic acid level -Keep holding metformin   Diabetes mellitus without complication (HCC) Seems well-controlled with A1c of 6.6 and CBG within goal. -Holding home metformin -Continue with SSI  Depression -Continue home Celexa  and Wellbutrin   Hyperlipidemia Continue pravastatin   Pressure ulcer Pressure ulcer present on admission and on dependent skin fold.  Present on admission.    Morbid obesity with BMI of 70 and over, adult (HCC) BMI 82.39  Hypotension Resolved.          Subjective: Patient considering going [place] his [place].  Feeling okay.  Yesterday did not walk with physical therapy and encouraged walking today.  Physical Exam: Vitals:   01/29/24 1652 01/29/24 1957 01/30/24 0452 01/30/24 0729  BP: 134/70 131/60 (!) 129/57 118/60  Pulse: 79 81 73 72  Resp: 16 18 18 16   Temp: (!) 97.5  F (36.4 C) 98.1 F (36.7 C) 98.2 F (36.8 C) 98.1 F (36.7 C)  TempSrc:    Oral  SpO2: 94% 96% 94% 94%  Weight:      Height:       Physical Exam HENT:     Head: Normocephalic.  Eyes:     General: Lids are normal.     Conjunctiva/sclera: Conjunctivae normal.  Cardiovascular:     Rate and Rhythm: Normal rate and regular  rhythm.     Heart sounds: Normal heart sounds, S1 normal and S2 normal.  Pulmonary:     Breath sounds: No decreased breath sounds, wheezing, rhonchi or rales.  Abdominal:     Palpations: Abdomen is soft.     Tenderness: There is no abdominal tenderness.  Musculoskeletal:     Right lower leg: Swelling present.     Left lower leg: Swelling present.  Skin:    General: Skin is warm.  Neurological:     Mental Status: He is alert and oriented to person, place, and time.     Data Reviewed: Last 3 sugars 142, 122, 162  Family Communication: Tried to reach daughter twice today and unable to leave a message  Disposition: Status is: Inpatient Remains inpatient appropriate because: Having trouble finding a rehab bed.  Patient considering [place] but unable to set up home health in his [place].  Planned Discharge Destination: Rehab    Time spent: 28 minutes Case discussed with TOC and we are unable to set up home health to his [place].  Author: Charlie Patterson, MD 01/30/2024 2:16 PM  For on call review [email or web address]  "

## 2024-01-31 DIAGNOSIS — L03116 Cellulitis of left lower limb: Secondary | ICD-10-CM | POA: Diagnosis not present

## 2024-01-31 DIAGNOSIS — E119 Type 2 diabetes mellitus without complications: Secondary | ICD-10-CM | POA: Diagnosis not present

## 2024-01-31 DIAGNOSIS — R7989 Other specified abnormal findings of blood chemistry: Secondary | ICD-10-CM | POA: Diagnosis not present

## 2024-01-31 DIAGNOSIS — I890 Lymphedema, not elsewhere classified: Secondary | ICD-10-CM | POA: Diagnosis not present

## 2024-01-31 LAB — GLUCOSE, CAPILLARY
Glucose-Capillary: 124 mg/dL — ABNORMAL HIGH (ref 70–99)
Glucose-Capillary: 152 mg/dL — ABNORMAL HIGH (ref 70–99)
Glucose-Capillary: 168 mg/dL — ABNORMAL HIGH (ref 70–99)
Glucose-Capillary: 125 mg/dL — ABNORMAL HIGH (ref 70–99)

## 2024-01-31 NOTE — Progress Notes (Signed)
 " Progress Note   Patient: Jack Owens FMW:[id number] DOB: 1965/08/31 DOA: 01/24/2024     7 DOS: the patient was seen and examined on 01/31/2024   Brief hospital course: Partly taken from H&P.   Jack Owens is a 59 y.o. male with medical history significant of morbid obesity, IIDM, HLD, HTN, chronic lymphedema, presented with worsening of left leg wound with pain, erythema and edema.  Patient also has an history of chronic lymphedema and wounds.  No fever and chills.  Came to ED to get some IV antibiotics.  On presentation stable vitals, no leukocytosis, mildly elevated lactic acid but patient was also taking metformin.  Rest of the labs at baseline.  CT scan of left thigh ordered to rule out any abscess. Started on cefepime  and vancomycin .  12/29: Vital stable, lower extremity venous Doppler negative for DVT.  Scrotal ultrasound was negative for any acute abnormality, did noted a small right epididymal dermal head cyst. CT of left thigh was suboptimal due to body habitus, did show subcutaneous thickening with no discrete loculated fluid collection.  Also shows multiple prominent left inguinal lymph nodes likely reactive.  No osseous abnormality.  Also noted to have significant left knee osteoarthritis which need to be investigated by orthopedic surgery as outpatient.  Patient is currently homeless, living [place], poor personal hygiene.  TOC was consulted to provide him with resources.  12/30: Remained hemodynamically stable with significant improvement in his thigh erythema, antibiotic switched with p.o. Augmentin  and doxycycline .  PT is recommending SNF-TOC is working on it, might be difficult disposition due to limited Medicaid beds availability. Discontinuing IV Lasix  12/31.  Blood pressure on the lower side will give a fluid bolus.  Holding antihypertensive medication. 01/28/2024.  Still waiting rehab referrals 1/2.  TOC expanding bed search for rehab. 1/3.  Patient thinking about  going home and not waiting for rehab bed.  Because [place] we are unable to set up home health to take care of this wound.  He will let me know on how he wants to proceed.  Assessment and Plan: * Cellulitis of left thigh Cellulitis of left posterior thigh and lower abdominal pannus. CT thigh and scrotal ultrasound was negative for any abscess or acute abnormality.  Did show superficial edema consistent with cellulitis.   Poor hygienic conditions as he was living [place] his [place] and was not taking showers. Clinically now seems improving - Initially on cefepime  and vancomycin  and switched over to Augmentin  and doxycycline .  Patient completed course of antibiotic. - Patient has a wound on a dependent pannus.   Chronic acquired lymphedema Continue Lasix .  Elevated lactic acid level -Keep holding metformin   Diabetes mellitus without complication (HCC) Seems well-controlled with A1c of 6.6 and CBG within goal. -Holding home metformin -Continue with SSI Can consider GLP-1 inhibitor as outpatient  Depression -Continue home Celexa  and Wellbutrin   Hyperlipidemia Continue pravastatin   Pressure ulcer Pressure ulcer present on admission and on dependent skin fold.  Present on admission.    Morbid obesity with BMI of 70 and over, adult (HCC) BMI 82.39  Hypotension Resolved.          Subjective: Patient feeling okay.  Admitted 7 days ago with cellulitis.  Physical Exam: Vitals:   01/30/24 1549 01/30/24 2028 01/31/24 0241 01/31/24 0844  BP: (!) 114/58 135/63 122/63 (!) 117/59  Pulse: 77 74 72 67  Resp: 18 18 18 17   Temp: 98.5 F (36.9 C) 98.3 F (  36.8 C) 98 F (36.7 C) 98 F (36.7 C)  TempSrc: Oral Oral Oral   SpO2: 96% 96% 97% 96%  Weight:      Height:       Physical Exam HENT:     Head: Normocephalic.  Eyes:     General: Lids are normal.     Conjunctiva/sclera: Conjunctivae normal.  Cardiovascular:     Rate and Rhythm: Normal rate and regular rhythm.      Heart sounds: Normal heart sounds, S1 normal and S2 normal.  Pulmonary:     Breath sounds: No decreased breath sounds, wheezing, rhonchi or rales.  Abdominal:     Palpations: Abdomen is soft.     Tenderness: There is no abdominal tenderness.  Musculoskeletal:     Right lower leg: Swelling present.     Left lower leg: Swelling present.  Skin:    General: Skin is warm.  Neurological:     Mental Status: He is alert and oriented to person, place, and time.     Data Reviewed: No new data  Family Communication: Tried to reach sister Avelina at patient's request but unable to leave a message because mailbox is full  Disposition: Status is: Inpatient Remains inpatient appropriate because: TOC looking into rehabs.  Patient has a wound that he will not be able to reach to address.  Patient lives in his [place] and unable to have home health with living in a [place].  Planned Discharge Destination: Rehab    Time spent: 28 minutes  Author: Charlie Patterson, MD 01/31/2024 1:34 PM  For on call review [email or web address]  "

## 2024-01-31 NOTE — Progress Notes (Signed)
 Physical Therapy Treatment Patient Details Name: Jack Owens MRN: [id number] DOB: [birth year] Today's Date: 01/31/2024   History of Present Illness Jack Owens is a 59 y.o. male with medical history significant of morbid obesity, IIDM, HLD, HTN, chronic lymphedema, presented with worsening of left leg rash swelling pain.     Patient lives in a mobile home lives, lifestyles very much sedentary due to morbid obesity.  He has a chronic lymphedema and 3 days ago he started to feel swelling pain and rash of the backside of the thigh and pannus.  Denied any fever or chills no trouble urinating or moving his bowels.        If plan is discharge home, recommend the following: A little help with walking and/or transfers;A little help with bathing/dressing/bathroom   Can travel by private vehicle     No  Equipment Recommendations  Rolling walker (2 wheels)       Precautions / Restrictions Precautions Precautions: Fall Recall of Precautions/Restrictions: Intact Restrictions Weight Bearing Restrictions Per Provider Order: No     Mobility  Bed Mobility Overal bed mobility: Needs Assistance Bed Mobility: Supine to Sit  Supine to sit: HOB elevated, Used rails, Modified independent (Device/Increase time) Sit to supine: Modified independent (Device/Increase time) General bed mobility comments: Patient requiring min a using hand held assist for sidelying to sitting EOB with HOB elevated and heavy usage of bed rail    Transfers Overall transfer level: Needs assistance Equipment used: Rolling walker (2 wheels) Transfers: Sit to/from Stand Sit to Stand: Modified independent (Device/Increase time) Step pivot transfers: Modified independent (Device/Increase time)   Ambulation/Gait Ambulation/Gait assistance: Contact guard assist Gait Distance (Feet): 25 Feet Assistive device: Rolling walker (2 wheels) Gait Pattern/deviations: Trunk flexed, Wide base of support Gait velocity: decreased   General Gait Details:  (Able to ambulate outside of room back to recliner in room at [facility] with patient indicating no LOB. unable to perform a 2nd time due to severe c/o knee pain.)    Balance Overall balance assessment: Needs assistance Sitting-balance support: Feet supported, No upper extremity supported Sitting balance-Leahy Scale: Good     Standing balance support: During functional activity, Bilateral upper extremity supported, Reliant on assistive device for balance Standing balance-Leahy Scale: Fair       Hotel Manager: No apparent difficulties  Cognition Arousal: Alert Behavior During Therapy: WFL for tasks assessed/performed   PT - Cognitive impairments: No apparent impairments    PT - Cognition Comments: Pt is A and O x 4 Following commands: Intact      Cueing Cueing Techniques: Verbal cues         Pertinent Vitals/Pain Pain Assessment Pain Assessment: 0-10 Pain Location: knee pain Pain Descriptors / Indicators: Aching Pain Intervention(s): Limited activity within patient's tolerance, Monitored during session, Premedicated before session, Repositioned     PT Goals (current goals can now be found in the care plan section) Acute Rehab PT Goals Patient Stated Goal: rehab if they can find me one PT Goal Formulation: With patient Progress towards PT goals: Progressing toward goals    Frequency    Min 2X/week       AM-PAC PT 6 Clicks Mobility   Outcome Measure  Help needed turning from your back to your side while in a flat bed without using bedrails?: A Little Help needed moving from lying on your back to sitting on the side of a flat bed without using bedrails?: A Little Help needed moving to and from a bed  to a chair (including a wheelchair)?: None Help needed standing up from a chair using your arms (e.g., wheelchair or bedside chair)?: None Help needed to walk in hospital room?: A Little Help needed climbing 3-5 steps  with a railing? : A Lot 6 Click Score: 19    End of Session   Activity Tolerance: Patient tolerated treatment well Patient left: in chair;with call bell/phone within reach Nurse Communication: Mobility status PT Visit Diagnosis: Other abnormalities of gait and mobility (R26.89)     Time: 8883-8867 PT Time Calculation (min) (ACUTE ONLY): 16 min  Charges:    $Gait Training: 8-22 mins PT General Charges $$ ACUTE PT VISIT: 1 Visit                    Rankin Essex PTA 01/31/2024, 1:31 PM

## 2024-02-01 LAB — BASIC METABOLIC PANEL WITH GFR
Anion gap: 9 (ref 5–15)
BUN: 27 mg/dL — ABNORMAL HIGH (ref 6–20)
CO2: 32 mmol/L (ref 22–32)
Calcium: 9.9 mg/dL (ref 8.9–10.3)
Chloride: 97 mmol/L — ABNORMAL LOW (ref 98–111)
Creatinine, Ser: 0.93 mg/dL (ref 0.61–1.24)
GFR, Estimated: >60 mL/min
Glucose, Bld: 121 mg/dL — ABNORMAL HIGH (ref 70–99)
Potassium: 4.9 mmol/L (ref 3.5–5.1)
Sodium: 138 mmol/L (ref 135–145)

## 2024-02-01 LAB — SYPHILIS: RPR W/REFLEX TO RPR TITER AND TREPONEMAL ANTIBODIES, TRADITIONAL SCREENING AND DIAGNOSIS ALGORITHM: RPR Ser Ql: NONREACTIVE

## 2024-02-01 LAB — VITAMIN B12: Vitamin B-12: 472 pg/mL (ref 180–914)

## 2024-02-01 LAB — GLUCOSE, CAPILLARY
Glucose-Capillary: 141 mg/dL — ABNORMAL HIGH (ref 70–99)
Glucose-Capillary: 145 mg/dL — ABNORMAL HIGH (ref 70–99)
Glucose-Capillary: 126 mg/dL — ABNORMAL HIGH (ref 70–99)
Glucose-Capillary: 124 mg/dL — ABNORMAL HIGH (ref 70–99)

## 2024-02-01 LAB — HIV ANTIBODY (ROUTINE TESTING W REFLEX): HIV Screen 4th Generation wRfx: NONREACTIVE

## 2024-02-01 LAB — TSH: TSH: 4.82 u[IU]/mL — ABNORMAL HIGH (ref 0.350–4.500)

## 2024-02-01 NOTE — Progress Notes (Signed)
 " Progress Note   Patient: Jack Owens FMW:[id number] DOB: 1965-09-13 DOA: 01/24/2024     8 DOS: the patient was seen and examined on 02/01/2024   Brief hospital course: Partly taken from H&P.   Jack Owens is a 59 y.o. male with medical history significant of morbid obesity, IIDM, HLD, HTN, chronic lymphedema, presented with worsening of left leg wound with pain, erythema and edema.  Patient also has an history of chronic lymphedema and wounds.  No fever and chills.  Came to ED to get some IV antibiotics.  On presentation stable vitals, no leukocytosis, mildly elevated lactic acid but patient was also taking metformin.  Rest of the labs at baseline.  CT scan of left thigh ordered to rule out any abscess. Started on cefepime  and vancomycin .  12/29: Vital stable, lower extremity venous Doppler negative for DVT.  Scrotal ultrasound was negative for any acute abnormality, did noted a small right epididymal dermal head cyst. CT of left thigh was suboptimal due to body habitus, did show subcutaneous thickening with no discrete loculated fluid collection.  Also shows multiple prominent left inguinal lymph nodes likely reactive.  No osseous abnormality.  Also noted to have significant left knee osteoarthritis which need to be investigated by orthopedic surgery as outpatient.  Patient is currently homeless, living [place] his [place], poor personal hygiene.  TOC was consulted to provide him with resources.  12/30: Remained hemodynamically stable with significant improvement in his thigh erythema, antibiotic switched with p.o. Augmentin  and doxycycline .  PT is recommending SNF-TOC is working on it, might be difficult disposition due to limited Medicaid beds availability. Discontinuing IV Lasix  12/31.  Blood pressure on the lower side will give a fluid bolus.  Holding antihypertensive medication. 01/28/2024.  Still waiting rehab referrals 1/2.  TOC expanding bed search for rehab. 1/3.  Patient thinking about  going home and not waiting for rehab bed.  Because [place] we are unable to set up home health to take care of this wound.  He will let me know on how he wants to proceed. 1/5.  Still unable to find a rehab bed.  TOC called over to [facility] and they are unable to accommodate him.  Patient wants to go home to his [place] and not out of [place] for rehab.  Plan will be to discharge home tomorrow.  Will refer to wound care center.  Assessment and Plan: * Cellulitis of left thigh Cellulitis of left posterior thigh and lower abdominal pannus. CT thigh and scrotal ultrasound was negative for any abscess or acute abnormality.  Did show superficial edema consistent with cellulitis.   Poor hygienic conditions as he was living [place] his [place] and was not taking showers. Clinically improved - Initially on cefepime  and vancomycin  and switched over to Augmentin  and doxycycline .  Patient completed course of antibiotic. - Patient has a wound on a dependent pannus.   Chronic acquired lymphedema Continue Lasix .  Elevated lactic acid level Can restart metformin as outpatient   Diabetes mellitus without complication (HCC) Seems well-controlled with A1c of 6.6 and CBG within goal. -Holding home metformin while here -Continue with SSI Can consider GLP-1 inhibitor as outpatient  Depression -Continue home Celexa  and Wellbutrin   Hyperlipidemia Continue pravastatin   Pressure ulcer Pressure ulcer present on admission and on dependent skin fold.  Present on admission.    1.Cleanse L posterior thigh/scrotal wounds with Vashe, do not rinse. Apply Aquacel ([facility] # [id number]) to wound beds daily and secure with  silicone foam or ABD pad and clothe tape whichever is preferred. 2. Cleanse underneath pannus/abdominal folds with soap and water, dry and apply Interdry AG as follows: Order Gerle[facility] # [phone] Measure and cut length of InterDry to fit in skin folds that have skin breakdown Tuck InterDry fabric into skin folds  in a single layer, allow for 2 inches of overhang from skin edges to allow for wicking to occur May remove to bathe; dry area thoroughly and then tuck into affected areas again Do not apply any creams or ointments when using InterDry DO NOT THROW AWAY FOR 5 DAYS unless soiled with stool DO NOT [facility] product, this will inactivate the silver in the material New sheet of Interdry should be applied after 5 days of use if patient continues to have skin breakdown   Unable to set up home health secondary to patient living in a [place].  Patient states that his medical clinic has a person that this wound so we may have to follow-up there 3 times a week.  Will also refer to the wound care clinic.  Morbid obesity with BMI of 70 and over, adult (HCC) BMI 82.39  Hypotension Resolved.          Subjective: Patient feels okay.  Walking with staff.  Urinating well.  Admitted with cellulitis and wound.  Physical Exam: Vitals:   01/31/24 0844 01/31/24 1451 01/31/24 1955 02/01/24 0735  BP: (!) 117/59 118/73 129/64 (!) 123/58  Pulse: 67 76 74 74  Resp: 17 17 18 17   Temp: 98 F (36.7 C) 97.7 F (36.5 C) 97.8 F (36.6 C) 98.6 F (37 C)  TempSrc:   Oral   SpO2: 96% 97% 94% 95%  Weight:      Height:       Physical Exam HENT:     Head: Normocephalic.  Eyes:     General: Lids are normal.     Conjunctiva/sclera: Conjunctivae normal.  Cardiovascular:     Rate and Rhythm: Normal rate and regular rhythm.     Heart sounds: Normal heart sounds, S1 normal and S2 normal.  Pulmonary:     Breath sounds: No decreased breath sounds, wheezing, rhonchi or rales.  Abdominal:     Palpations: Abdomen is soft.     Tenderness: There is no abdominal tenderness.  Musculoskeletal:     Right lower leg: Swelling present.     Left lower leg: Swelling present.  Skin:    General: Skin is warm.  Neurological:     Mental Status: He is alert and oriented to person, place, and time.     Data Reviewed: Creatinine 0.93, TSH  4.8 glucose 121 Family Communication: Spoke with daughters on the phone  Disposition: Status is: Inpatient Remains inpatient appropriate because: Since we are unable to find a rehab bed at this time patient will want to go home to his baseline and follow-up with his medical doctor.  Will refer to the wound care center.  Will plan on discharge tomorrow.  Planned Discharge Destination: [place]    Time spent: 28 minutes  Author: Charlie Patterson, MD 02/01/2024 1:00 PM  For on call review [email or web address]  "

## 2024-02-01 NOTE — Plan of Care (Signed)
  Problem: Clinical Measurements: Goal: Diagnostic test results will improve Outcome: Progressing   Problem: Clinical Measurements: Goal: Will remain free from infection Outcome: Progressing   Problem: Health Behavior/Discharge Planning: Goal: Ability to manage health-related needs will improve Outcome: Progressing   Problem: Education: Goal: Knowledge of General Education information will improve Description: Including pain rating scale, medication(s)/side effects and non-pharmacologic comfort measures Outcome: Progressing   

## 2024-02-01 NOTE — Progress Notes (Signed)
 Physical Therapy Treatment Patient Details Name: Jack Owens MRN: [id number] DOB: 05/03/1965 Today's Date: 02/01/2024   History of Present Illness Jack Owens is a 59 y.o. male with medical history significant of morbid obesity, IIDM, HLD, HTN, chronic lymphedema, presented with worsening of left leg rash swelling pain.     Patient lives in a mobile home lives, lifestyles very much sedentary due to morbid obesity.  He has a chronic lymphedema and 3 days ago he started to feel swelling pain and rash of the backside of the thigh and pannus.  Denied any fever or chills no trouble urinating or moving his bowels.    PT Comments  Pt ambulated to/from bathroom with nursing earlier today with use of Bariatric RW for optimal support and balance. This pm therapist reassured pt of benefits using RW by completing short distance gait in room with SPC. Pt without LOB however did reach for furniture to steady self and appears at a much higher fall risk with single supported device. Therefore will continue to recommend Bariatric RW to be received at d/c.   If plan is discharge home, recommend the following: A little help with walking and/or transfers;A little help with bathing/dressing/bathroom   Can travel by private vehicle     No  Equipment Recommendations  Rolling walker (2 wheels) BARIATRIC   Recommendations for Other Services       Precautions / Restrictions Precautions Precautions: Fall Recall of Precautions/Restrictions: Intact Restrictions Weight Bearing Restrictions Per Provider Order: No     Mobility  Bed Mobility Overal bed mobility: Modified Independent                  Transfers Overall transfer level: Needs assistance Equipment used: Rolling walker (2 wheels) Transfers: Sit to/from Stand Sit to Stand: Supervision                Ambulation/Gait Ambulation/Gait assistance: Contact guard assist Gait Distance (Feet):  (15) Assistive device: Straight cane Gait  Pattern/deviations: Trunk flexed, Wide base of support Gait velocity: decreased     General Gait Details: Completed gait with SPC to reassure pt on recommendations for better safety while using Bariatric RW   Stairs             Wheelchair Mobility     Tilt Bed    Modified Rankin (Stroke Patients Only)       Balance Overall balance assessment: Needs assistance Sitting-balance support: Feet supported, No upper extremity supported Sitting balance-Leahy Scale: Good     Standing balance support: During functional activity, Bilateral upper extremity supported, Reliant on assistive device for balance Standing balance-Leahy Scale: Fair Standing balance comment:  (Unsafe with single UE support)                            Communication Communication Communication: No apparent difficulties  Cognition Arousal: Alert Behavior During Therapy: WFL for tasks assessed/performed                             Following commands: Intact      Cueing    Exercises      General Comments General comments (skin integrity, edema, etc.):  (Pt educated on benefits of using Bariatric RW)      Pertinent Vitals/Pain Pain Assessment Pain Assessment: No/denies pain    Home Living  Prior Function            PT Goals (current goals can now be found in the care plan section) Progress towards PT goals: Progressing toward goals    Frequency    Min 2X/week      PT Plan      Co-evaluation              AM-PAC PT 6 Clicks Mobility   Outcome Measure  Help needed turning from your back to your side while in a flat bed without using bedrails?: A Little Help needed moving from lying on your back to sitting on the side of a flat bed without using bedrails?: A Little Help needed moving to and from a bed to a chair (including a wheelchair)?: None Help needed standing up from a chair using your arms (e.g., wheelchair or  bedside chair)?: None Help needed to walk in hospital room?: A Little Help needed climbing 3-5 steps with a railing? : A Lot 6 Click Score: 19    End of Session Equipment Utilized During Treatment: Gait belt Activity Tolerance: Patient tolerated treatment well Patient left: in chair;with call bell/phone within reach Nurse Communication: Mobility status PT Visit Diagnosis: Other abnormalities of gait and mobility (R26.89)     Time: 8359-8343 PT Time Calculation (min) (ACUTE ONLY): 16 min  Charges:    $Therapeutic Activity: 8-22 mins PT General Charges $$ ACUTE PT VISIT: 1 Visit                    Darice Bohr, PTA  Darice JAYSON Bohr 02/01/2024, 5:26 PM

## 2024-02-01 NOTE — Discharge Instructions (Addendum)
 1.Cleanse L posterior thigh/scrotal wounds with Vashe, do not rinse. Apply Aquacel Soila # [phone]) to wound beds daily and secure with silicone foam or ABD pad and clothe tape whichever is preferred. 2. Cleanse underneath pannus/abdominal folds with soap and water, dry and apply Interdry AG as follows: Order Gerlean # [id number] Measure and cut length of InterDry to fit in skin folds that have skin breakdown Tuck InterDry fabric into skin folds in a single layer, allow for 2 inches of overhang from skin edges to allow for wicking to occur May remove to bathe; dry area thoroughly and then tuck into affected areas again Do not apply any creams or ointments when using InterDry DO NOT THROW AWAY FOR 5 DAYS unless soiled with stool DO NOT [facility] product, this will inactivate the silver in the material New sheet of Interdry should be applied after 5 days of use if patient continues to have skin breakdown   Unfortunately unable to set up home health.  Will refer to wound care center.  Patient [facility]

## 2024-02-01 NOTE — Progress Notes (Signed)
 Occupational Therapy Treatment Patient Details Name: Jack Owens MRN: [id number] DOB: 03/29/1965 Today's Date: 02/01/2024   History of present illness [name] is a 59 y.o. male with medical history significant of morbid obesity, IIDM, HLD, HTN, chronic lymphedema, presented with worsening of left leg rash swelling pain.     Patient lives in a mobile home lives, lifestyles very much sedentary due to morbid obesity.  He has a chronic lymphedema and 3 days ago he started to feel swelling pain and rash of the backside of the thigh and pannus.  Denied any fever or chills no trouble urinating or moving his bowels.   OT comments  Mr Henshaw was seen for OT treatment on this date. Upon arrival to room pt in bed, agreeable to tx. Pt requires CGA + RW for ADL t/f ~20 ft, cues for safe RW technique. MAX A for LB access in sitting. Educated on ECS and RW technique. Pt making good progress toward goals, will continue to follow POC. Discharge recommendation remains appropriate.        If plan is discharge home, recommend the following:  A little help with walking and/or transfers;A little help with bathing/dressing/bathroom   Equipment Recommendations  None recommended by OT    Recommendations for Other Services      Precautions / Restrictions Precautions Precautions: Fall Recall of Precautions/Restrictions: Intact Restrictions Weight Bearing Restrictions Per Provider Order: No       Mobility Bed Mobility Overal bed mobility: Modified Independent                  Transfers Overall transfer level: Needs assistance Equipment used: Rolling walker (2 wheels) Transfers: Sit to/from Stand Sit to Stand: Supervision                 Balance Overall balance assessment: Needs assistance Sitting-balance support: Feet supported, No upper extremity supported Sitting balance-Leahy Scale: Good     Standing balance support: Single extremity supported, During functional  activity Standing balance-Leahy Scale: Fair                             ADL either performed or assessed with clinical judgement   ADL Overall ADL's : Needs assistance/impaired                                       General ADL Comments: CGA + RW for ADL t/f ~20 ft, cues for safe RW technique. MAX A for LB access in sitting.     Communication Communication Communication: No apparent difficulties   Cognition Arousal: Alert Behavior During Therapy: WFL for tasks assessed/performed Cognition: No apparent impairments                               Following commands: Intact                      Pertinent Vitals/ Pain       Pain Assessment Pain Assessment: No/denies pain   Frequency  Min 2X/week        Progress Toward Goals  OT Goals(current goals can now be found in the care plan section)  Progress towards OT goals: Progressing toward goals     Plan      Co-evaluation  AM-PAC OT 6 Clicks Daily Activity     Outcome Measure   Help from another person eating meals?: None Help from another person taking care of personal grooming?: None Help from another person toileting, which includes using toliet, bedpan, or urinal?: A Little Help from another person bathing (including washing, rinsing, drying)?: A Little Help from another person to put on and taking off regular upper body clothing?: None Help from another person to put on and taking off regular lower body clothing?: A Little 6 Click Score: 21    End of Session Equipment Utilized During Treatment: Rolling walker (2 wheels)  OT Visit Diagnosis: Muscle weakness (generalized) (M62.81);Unsteadiness on feet (R26.81)   Activity Tolerance Patient tolerated treatment well   Patient Left in chair;with call bell/phone within reach   Nurse Communication Mobility status        Time: 1100-1109 OT Time Calculation (min): 9 min  Charges: OT  General Charges $OT Visit: 1 Visit OT Treatments $Self Care/Home Management : 8-22 mins  Elston Slot, M.S. OTR/L  02/01/2024, 1:43 PM  ascom [phone]

## 2024-02-01 NOTE — Plan of Care (Signed)

## 2024-02-02 ENCOUNTER — Other Ambulatory Visit: Payer: Self-pay

## 2024-02-02 LAB — GLUCOSE, CAPILLARY: Glucose-Capillary: 150 mg/dL — ABNORMAL HIGH (ref 70–99)

## 2024-02-02 MED ORDER — GERHARDT'S BUTT CREAM
TOPICAL_CREAM | CUTANEOUS | 0 refills | Status: AC
  Filled 2024-02-02: qty 60, fill #0

## 2024-02-02 MED ORDER — FUROSEMIDE 40 MG PO TABS
40.0000 mg | ORAL_TABLET | Freq: Every day | ORAL | 0 refills | Status: AC
  Filled 2024-02-02: qty 30, 30d supply, fill #0

## 2024-02-02 NOTE — Plan of Care (Signed)

## 2024-02-02 NOTE — TOC CM/SW Note (Signed)
 Transition of Care [facility]) CM/SW Note   Occupational Therapy * Physical Therapy * Speech Therapy  DATE 02/02/2024 PATIENT NAME  Jack Owens PATIENT MRN [id number]   DIAGNOSIS/DIAGNOSIS CODE  L03.116  DATE OF DISCHARGE  02/02/2024  PRIMARY CARE PHYSICIAN Maurilio Pouch PCP PHONE/FAX  [phone]       I certify that I have examined this patient and that occupational/physical/speech therapy is necessary on an outpatient basis.    The patient has expressed interest in completing their recommended course of therapy at your location.  Once a formal order from the patient's primary care physician has been obtained, please contact him/her to schedule an appointment for evaluation at your earliest convenience.  [ x ]  Physical Therapy Evaluate and Treat  [ x ]  Occupational Therapy Evaluate and Treat  [  ]  Speech Therapy Evaluate and Treat  The patient's primary care physician (listed above) must furnish and be responsible for a formal order such that the recommended services may be furnished while under the primary physician's care, and that the plan of care will be established and reviewed every 30 days (or more often if condition necessitates).

## 2024-02-02 NOTE — Discharge Summary (Signed)
 " Physician Discharge Summary   Patient: Jack Owens MRN: [id number] DOB: January 10, 1966  Admit date:     01/24/2024  Discharge date: 02/02/2024  Discharge Physician: Jack Owens   PCP: Jack Herring, MD   Recommendations at discharge:   Follow-up PCP with appointment tomorrow Patient states that the PCP office did not have any person that those wounds can follow-up there. Refer to [facility] wound care center  Discharge Diagnoses: Principal Problem:   Cellulitis of left thigh Active Problems:   Chronic acquired lymphedema   Elevated lactic acid level   Diabetes mellitus without complication (HCC)   Depression   Hyperlipidemia   Hypotension   Morbid obesity with BMI of 70 and over, adult (HCC)   Pressure ulcer   Hospital Course: Partly taken from H&P.   Jack Owens is a 59 y.o. male with medical history significant of morbid obesity, IIDM, HLD, HTN, chronic lymphedema, presented with worsening of left leg wound with pain, erythema and edema.  Patient also has an history of chronic lymphedema and wounds.  No fever and chills.  Came to ED to get some IV antibiotics.  On presentation stable vitals, no leukocytosis, mildly elevated lactic acid but patient was also taking metformin.  Rest of the labs at baseline.  CT scan of left thigh ordered to rule out any abscess. Started on cefepime  and vancomycin .  12/29: Vital stable, lower extremity venous Doppler negative for DVT.  Scrotal ultrasound was negative for any acute abnormality, did noted a small right epididymal dermal head cyst. CT of left thigh was suboptimal due to body habitus, did show subcutaneous thickening with no discrete loculated fluid collection.  Also shows multiple prominent left inguinal lymph nodes likely reactive.  No osseous abnormality.  Also noted to have significant left knee osteoarthritis which need to be investigated by orthopedic surgery as outpatient.  Patient is currently homeless, [place],  poor personal hygiene.  TOC was consulted to provide him with resources.  12/30: Remained hemodynamically stable with significant improvement in his thigh erythema, antibiotic switched with p.o. Augmentin  and doxycycline .  PT is recommending SNF-TOC is working on it, might be difficult disposition due to limited Medicaid beds availability. Discontinuing IV Lasix  12/31.  Blood pressure on the lower side will give a fluid bolus.  Holding antihypertensive medication. 01/28/2024.  Still waiting rehab referrals 1/2.  TOC expanding bed search for rehab. 1/3.  Patient thinking about going home and not waiting for rehab bed.  Because [place] we are unable to set up home health to take care of this wound.  He will let me know on how he wants to proceed. 1/5.  Still unable to find a rehab bed.  TOC called over to [facility] and they are unable to accommodate him.  Patient wants to go [place] and not out of [place] for rehab.  Plan will be to discharge home tomorrow.  Will refer to wound care center. 1/6.  Unable to find a rehab bed with his weight and insurance.  Unable to set up home health because he [place] his [place].  Patient will discharge home with follow-up at his medical doctor.  Referred to wound care center.  Assessment and Plan: * Cellulitis of left thigh Cellulitis of left posterior thigh and lower abdominal pannus. CT thigh and scrotal ultrasound was negative for any abscess or acute abnormality.  Did show superficial edema consistent with cellulitis.   Poor hygienic conditions as  he was living [place] and was not taking showers. Clinically improved - Initially on cefepime  and vancomycin  and switched over to Augmentin  and doxycycline .  Patient completed course of antibiotic. - Patient has a wound on a dependent pannus.   Chronic acquired lymphedema Continue Lasix .  Elevated lactic acid level Can restart metformin as outpatient   Diabetes mellitus without complication  (HCC) Seems well-controlled with A1c of 6.6 and CBG within goal. -Holding home metformin while here -Continue with SSI Can consider GLP-1 inhibitor as outpatient  Depression -Continue home Celexa  and Wellbutrin   Hyperlipidemia Continue pravastatin   Pressure ulcer Pressure ulcer present on admission and on dependent skin fold.  Present on admission.    1.Cleanse L posterior thigh/scrotal wounds with Vashe, do not rinse. Apply Aquacel Soila # [phone]) to wound beds daily and secure with silicone foam or ABD pad and clothe tape whichever is preferred. 2. Cleanse underneath pannus/abdominal folds with soap and water, dry and apply Interdry AG as follows: Order Gerlean # [id number] Measure and cut length of InterDry to fit in skin folds that have skin breakdown Tuck InterDry fabric into skin folds in a single layer, allow for 2 inches of overhang from skin edges to allow for wicking to occur May remove to bathe; dry area thoroughly and then tuck into affected areas again Do not apply any creams or ointments when using InterDry DO NOT THROW AWAY FOR 5 DAYS unless soiled with stool DO NOT [facility] product, this will inactivate the silver in the material New sheet of Interdry should be applied after 5 days of use if patient continues to have skin breakdown   Unable to set up home health secondary to patient living in a [place].  Patient states that his medical clinic has a person that this wound so we may have to follow-up there 3 times a week.  Will also refer to the wound care clinic.  Morbid obesity with BMI of 70 and over, adult (HCC) BMI 82.39  Hypotension Resolved.           Consultants: Wound care nurse Procedures performed: None Disposition: Home Diet recommendation:  Carb modified diet DISCHARGE MEDICATION: Allergies as of 02/02/2024   No Known Allergies      Medication List     STOP taking these medications    hydrochlorothiazide 25 MG tablet Commonly known as: HYDRODIURIL    lisinopril  20 MG tablet Commonly known as: ZESTRIL    metFORMIN 500 MG tablet Commonly known as: GLUCOPHAGE       TAKE these medications    buPROPion  150 MG 24 hr tablet Commonly known as: WELLBUTRIN  XL Take 150 mg by mouth daily.   citalopram  40 MG tablet Commonly known as: CELEXA  Take 40 mg by mouth daily.   furosemide  40 MG tablet Commonly known as: LASIX  Take 1 tablet (40 mg total) by mouth daily.   Gerhardt's butt cream Crea Apply to groin and inner thigh twice a day   lovastatin 40 MG tablet Commonly known as: MEVACOR Take 40 mg by mouth at bedtime.   solifenacin 10 MG tablet Commonly known as: VESICARE Take 10 mg by mouth daily.               Durable Medical Equipment  (From admission, onward)           Start     Ordered   02/02/24 0918  For home use only DME Walker rolling  Once       Comments: bariatric  Question  Answer Comment  Walker: With 5 Inch Wheels   Patient needs a walker to treat with the following condition Generalized weakness      02/02/24 0917            Follow-up Information     Jack Herring, MD Follow up.   Specialty: Family Medicine Why: kep appointment on 02/03/24 Contact information: [address] [place] [address] [phone]         [facility] Follow up in 1 week(s).   Specialty: Wound Care Contact information: [address] [phone]               Discharge Exam: Fredricka Weights   01/24/24 0139  Weight: (!) 217.7 kg   Physical Exam HENT:     Head: Normocephalic.  Eyes:     General: Lids are normal.     Conjunctiva/sclera: Conjunctivae normal.  Cardiovascular:     Rate and Rhythm: Normal rate and regular rhythm.     Heart sounds: Normal heart sounds, S1 normal and S2 normal.  Pulmonary:     Breath sounds: No decreased breath sounds, wheezing, rhonchi or rales.  Abdominal:     Palpations: Abdomen is soft.      Tenderness: There is no abdominal tenderness.  Musculoskeletal:     Right lower leg: Swelling present.     Left lower leg: Swelling present.  Skin:    General: Skin is warm.  Neurological:     Mental Status: He is alert and oriented to person, place, and time.      Condition at discharge: stable  The results of significant diagnostics from this hospitalization (including imaging, microbiology, ancillary and laboratory) are listed below for reference.   Imaging Studies: US  Venous Img Lower Unilateral Left (DVT) Result Date: 01/25/2024 CLINICAL DATA:  Swelling and cellulitis EXAM: LEFT LOWER EXTREMITY VENOUS DOPPLER ULTRASOUND TECHNIQUE: Gray-scale sonography with compression, as well as color and duplex ultrasound, were performed to evaluate the deep venous system(s) from the level of the common femoral vein through the popliteal and proximal calf veins. COMPARISON:  None available FINDINGS: VENOUS Normal compressibility of the common femoral, superficial femoral, and popliteal veins, as well as the visualized calf veins. Visualized portions of profunda femoral vein and great saphenous vein unremarkable. No filling defects to suggest DVT on grayscale or color Doppler imaging. Doppler waveforms show normal direction of venous flow, normal respiratory plasticity and response to augmentation. OTHER None. Limitations: none IMPRESSION: No DVT of the left lower extremity. Electronically Signed   By: Aliene Lloyd M.D.   On: 01/25/2024 07:34   US  SCROTUM Result Date: 01/25/2024 EXAM: ULTRASOUND SCROTUM/TESTICLES WITH DOPPLER FLOW EVALUATION 01/24/2024 01:28:18 PM TECHNIQUE: Duplex ultrasound using B-mode/gray scaled imaging, Doppler spectral analysis and color flow Doppler was obtained of the testicles. COMPARISON: None available. CLINICAL HISTORY: Swelling. FINDINGS: RIGHT: GREY SCALE: The right testicle measures 3.4 x 1.4 x 2.6 cm. It demonstrates normal homogeneous echotexture without focal lesion.  No testicular microlithiasis. DOPPLER EVALUATION: There is normal color doppler  flow within the testicle. VARICOCELE: No scrotal varicocele. SCROTAL SAC: No hydrocele. EPIDIDYMIS: No acute abnormality. There is a 5 mm right epididymal head cyst. LEFT: GREY SCALE: The left testicle measures 2.8 x 1.3 x 2.5 cm. It demonstrates normal homogeneous echotexture without focal lesion. No testicular microlithiasis. DOPPLER EVALUATION: There is normal color Doppler flow within the testicle. VARICOCELE: No scrotal varicocele. SCROTAL SAC: No hydrocele. EPIDIDYMIS: No acute abnormality. IMPRESSION: 1.  No evidence of testicular torsion or other acute scrotal abnormality. 2. 5 mm right epididymal head cyst. Electronically signed by: Greig Pique MD 01/25/2024 12:50 AM EST RP Workstation: [id number]   CT FEMUR LEFT W CONTRAST Result Date: 01/24/2024 CLINICAL DATA:  Concern for soft tissue infection. EXAM: CT OF THE LOWER LEFT EXTREMITY WITH CONTRAST TECHNIQUE: Multidetector CT imaging of the lower left extremity was performed according to the standard protocol following intravenous contrast administration. RADIATION DOSE REDUCTION: This exam was performed according to the departmental dose-optimization program which includes automated exposure control, adjustment of the mA and/or kV according to patient size and/or use of iterative reconstruction technique. CONTRAST:  OMNIPAQUE  IOHEXOL  300 MG/ML  SOLN COMPARISON:  None Available. FINDINGS: Evaluation is significantly limited due to suboptimal positioning and body habitus. Bones/Joint/Cartilage No fracture or dislocation. Tricompartmental osteoarthritis of the left knee most severe in the medial femorotibial compartment with severe joint space narrowing resulting in bone-on-bone contact. Suspected small knee joint effusion. Soft tissue and Muscles Subcutaneous infiltration and fat stranding with cutaneous thickening of the proximal to mid medial left thigh and the  adjacent overlying pannus. No discrete loculated fluid collection or soft tissue gas identified. Multiple prominent left inguinal lymph nodes are likely reactive. IMPRESSION: 1. Evaluation is significantly limited due to suboptimal positioning and body habitus. 2. Subcutaneous fat stranding and edema with cutaneous thickening of the proximal to mid medial left thigh and the adjacent overlying pannus, compatible cellulitis in the appropriate setting. No discrete loculated fluid collection or soft tissue gas identified. 3. Multiple prominent left inguinal lymph nodes are likely reactive. 4. No acute osseous abnormality. 5. Tricompartmental osteoarthritis of the left knee most severe in the medial femorotibial compartment with severe joint space narrowing resulting in bone-on-bone contact. Suspected small knee joint effusion. Electronically Signed   By: Harrietta Sherry M.D.   On: 01/24/2024 18:20    Microbiology: No results found for this or any previous visit.  Labs: CBC: Recent Labs  Lab 01/29/24 0520  WBC 6.5  HGB 11.3*  HCT 36.8*  MCV 89.8  PLT 388   Basic Metabolic Panel: Recent Labs  Lab 01/29/24 0520 02/01/24 0607  NA 137 138  K 4.9 4.9  CL 100 97*  CO2 28 32  GLUCOSE 121* 121*  BUN 21* 27*  CREATININE 0.76 0.93  CALCIUM 9.4 9.9   Liver Function Tests: No results for input(s): AST, ALT, ALKPHOS, BILITOT, PROT, ALBUMIN in the last 168 hours. CBG: Recent Labs  Lab 02/01/24 0736 02/01/24 1144 02/01/24 1708 02/01/24 2124 02/02/24 0914  GLUCAP 141* 145* 126* 124* 150*    Discharge time spent: greater than 30 minutes.  Signed: Charlie Patterson, MD [facility] 02/02/2024 "

## 2024-02-26 ENCOUNTER — Observation Stay
Admission: EM | Admit: 2024-02-26 | Discharge: 2024-03-03 | DRG: 602 | Disposition: A | Source: Ambulatory Visit | Attending: Internal Medicine | Admitting: Internal Medicine

## 2024-02-26 ENCOUNTER — Other Ambulatory Visit: Payer: Self-pay

## 2024-02-26 ENCOUNTER — Encounter: Payer: Self-pay | Admitting: Internal Medicine

## 2024-02-26 DIAGNOSIS — F329 Major depressive disorder, single episode, unspecified: Secondary | ICD-10-CM | POA: Diagnosis present

## 2024-02-26 DIAGNOSIS — L03116 Cellulitis of left lower limb: Secondary | ICD-10-CM | POA: Diagnosis not present

## 2024-02-26 DIAGNOSIS — E119 Type 2 diabetes mellitus without complications: Secondary | ICD-10-CM

## 2024-02-26 DIAGNOSIS — E785 Hyperlipidemia, unspecified: Secondary | ICD-10-CM | POA: Diagnosis present

## 2024-02-26 DIAGNOSIS — E66813 Obesity, class 3: Secondary | ICD-10-CM | POA: Diagnosis present

## 2024-02-26 DIAGNOSIS — I890 Lymphedema, not elsewhere classified: Secondary | ICD-10-CM | POA: Diagnosis present

## 2024-02-26 DIAGNOSIS — L89893 Pressure ulcer of other site, stage 3: Secondary | ICD-10-CM | POA: Diagnosis present

## 2024-02-26 DIAGNOSIS — E1165 Type 2 diabetes mellitus with hyperglycemia: Secondary | ICD-10-CM | POA: Diagnosis present

## 2024-02-26 DIAGNOSIS — Z6841 Body Mass Index (BMI) 40.0 and over, adult: Secondary | ICD-10-CM

## 2024-02-26 DIAGNOSIS — I10 Essential (primary) hypertension: Secondary | ICD-10-CM | POA: Diagnosis present

## 2024-02-26 DIAGNOSIS — L03314 Cellulitis of groin: Principal | ICD-10-CM | POA: Diagnosis present

## 2024-02-26 DIAGNOSIS — D75839 Thrombocytosis, unspecified: Secondary | ICD-10-CM | POA: Diagnosis present

## 2024-02-26 DIAGNOSIS — Z79899 Other long term (current) drug therapy: Secondary | ICD-10-CM

## 2024-02-26 DIAGNOSIS — Z5320 Procedure and treatment not carried out because of patient's decision for unspecified reasons: Secondary | ICD-10-CM | POA: Diagnosis present

## 2024-02-26 DIAGNOSIS — Z5902 Unsheltered homelessness: Secondary | ICD-10-CM

## 2024-02-26 DIAGNOSIS — L0390 Cellulitis, unspecified: Secondary | ICD-10-CM | POA: Diagnosis present

## 2024-02-26 LAB — COMPREHENSIVE METABOLIC PANEL WITH GFR
ALT: 14 U/L (ref 0–44)
AST: 19 U/L (ref 15–41)
Albumin: 4.3 g/dL (ref 3.5–5.0)
Alkaline Phosphatase: 91 U/L (ref 38–126)
Anion gap: 9 (ref 5–15)
BUN: 12 mg/dL (ref 6–20)
CO2: 29 mmol/L (ref 22–32)
Calcium: 9.5 mg/dL (ref 8.9–10.3)
Chloride: 101 mmol/L (ref 98–111)
Creatinine, Ser: 0.7 mg/dL (ref 0.61–1.24)
GFR, Estimated: >60 mL/min
Glucose, Bld: 109 mg/dL — ABNORMAL HIGH (ref 70–99)
Potassium: 4.2 mmol/L (ref 3.5–5.1)
Sodium: 139 mmol/L (ref 135–145)
Total Bilirubin: 0.3 mg/dL (ref 0.0–1.2)
Total Protein: 8.3 g/dL — ABNORMAL HIGH (ref 6.5–8.1)

## 2024-02-26 LAB — PROTIME-INR
INR: 1 (ref 0.8–1.2)
Prothrombin Time: 13.5 s (ref 11.4–15.2)

## 2024-02-26 LAB — URINALYSIS, W/ REFLEX TO CULTURE (INFECTION SUSPECTED)
Bilirubin Urine: NEGATIVE
Glucose, UA: NEGATIVE mg/dL
Hgb urine dipstick: NEGATIVE
Ketones, ur: NEGATIVE mg/dL
Leukocytes,Ua: NEGATIVE
Nitrite: NEGATIVE
Protein, ur: NEGATIVE mg/dL
Specific Gravity, Urine: 1.02 (ref 1.005–1.030)
pH: 5 (ref 5.0–8.0)

## 2024-02-26 LAB — CBC WITH DIFFERENTIAL/PLATELET
Abs Immature Granulocytes: 0.06 10*3/uL (ref 0.00–0.07)
Basophils Absolute: 0.1 10*3/uL (ref 0.0–0.1)
Basophils Relative: 1 %
Eosinophils Absolute: 0.5 10*3/uL (ref 0.0–0.5)
Eosinophils Relative: 5 %
HCT: 40.1 % (ref 39.0–52.0)
Hemoglobin: 12.3 g/dL — ABNORMAL LOW (ref 13.0–17.0)
Immature Granulocytes: 1 %
Lymphocytes Relative: 26 %
Lymphs Abs: 2.4 10*3/uL (ref 0.7–4.0)
MCH: 27.8 pg (ref 26.0–34.0)
MCHC: 30.7 g/dL (ref 30.0–36.0)
MCV: 90.5 fL (ref 80.0–100.0)
Monocytes Absolute: 0.7 10*3/uL (ref 0.1–1.0)
Monocytes Relative: 8 %
Neutro Abs: 5.4 10*3/uL (ref 1.7–7.7)
Neutrophils Relative %: 59 %
Platelets: 413 10*3/uL — ABNORMAL HIGH (ref 150–400)
RBC: 4.43 MIL/uL (ref 4.22–5.81)
RDW: 14 % (ref 11.5–15.5)
WBC: 9.1 10*3/uL (ref 4.0–10.5)
nRBC: 0 % (ref 0.0–0.2)

## 2024-02-26 LAB — MAGNESIUM: Magnesium: 2 mg/dL (ref 1.7–2.4)

## 2024-02-26 LAB — SEDIMENTATION RATE: Sed Rate: 45 mm/h — ABNORMAL HIGH (ref 0–20)

## 2024-02-26 LAB — GLUCOSE, CAPILLARY: Glucose-Capillary: 129 mg/dL — ABNORMAL HIGH (ref 70–99)

## 2024-02-26 LAB — LACTIC ACID, PLASMA: Lactic Acid, Venous: 1.4 mmol/L (ref 0.5–1.9)

## 2024-02-26 MED ORDER — VANCOMYCIN HCL 2000 MG/400ML IV SOLN
2000.0000 mg | Freq: Two times a day (BID) | INTRAVENOUS | Status: DC
  Administered 2024-02-27 – 2024-03-01 (×7): 2000 mg via INTRAVENOUS
  Filled 2024-02-26 (×10): qty 400

## 2024-02-26 MED ORDER — SODIUM CHLORIDE 0.9 % IV SOLN
2.0000 g | Freq: Once | INTRAVENOUS | Status: AC
  Administered 2024-02-26: 2 g via INTRAVENOUS
  Filled 2024-02-26: qty 12.5

## 2024-02-26 MED ORDER — INSULIN ASPART 100 UNIT/ML IJ SOLN
0.0000 [IU] | Freq: Three times a day (TID) | INTRAMUSCULAR | Status: DC
  Administered 2024-02-27 (×3): 1 [IU] via SUBCUTANEOUS
  Administered 2024-02-28: 2 [IU] via SUBCUTANEOUS
  Administered 2024-02-28 – 2024-03-02 (×7): 1 [IU] via SUBCUTANEOUS
  Administered 2024-03-03: 2 [IU] via SUBCUTANEOUS
  Filled 2024-02-26 (×3): qty 1
  Filled 2024-02-26: qty 2
  Filled 2024-02-26 (×2): qty 1
  Filled 2024-02-26: qty 2
  Filled 2024-02-26 (×5): qty 1

## 2024-02-26 MED ORDER — HEPARIN SODIUM (PORCINE) 5000 UNIT/ML IJ SOLN
5000.0000 [IU] | Freq: Three times a day (TID) | INTRAMUSCULAR | Status: DC
  Administered 2024-02-26 – 2024-03-03 (×15): 5000 [IU] via SUBCUTANEOUS
  Filled 2024-02-26 (×16): qty 1

## 2024-02-26 MED ORDER — SODIUM CHLORIDE 0.9 % IV SOLN
2.0000 g | Freq: Three times a day (TID) | INTRAVENOUS | Status: DC
  Administered 2024-02-27 – 2024-03-02 (×10): 2 g via INTRAVENOUS
  Filled 2024-02-26 (×14): qty 12.5

## 2024-02-26 MED ORDER — ACETAMINOPHEN 325 MG PO TABS
650.0000 mg | ORAL_TABLET | Freq: Four times a day (QID) | ORAL | Status: DC | PRN
  Administered 2024-02-27 – 2024-03-02 (×4): 650 mg via ORAL
  Filled 2024-02-26 (×4): qty 2

## 2024-02-26 MED ORDER — LABETALOL HCL 5 MG/ML IV SOLN: 10.0000 mg | INTRAVENOUS | Status: DC | PRN

## 2024-02-26 MED ORDER — SENNOSIDES-DOCUSATE SODIUM 8.6-50 MG PO TABS
1.0000 | ORAL_TABLET | Freq: Every evening | ORAL | Status: DC | PRN
  Filled 2024-02-26: qty 1

## 2024-02-26 MED ORDER — INSULIN ASPART 100 UNIT/ML IJ SOLN: 0.0000 [IU] | Freq: Every day | INTRAMUSCULAR | Status: DC

## 2024-02-26 MED ORDER — VANCOMYCIN HCL IN DEXTROSE 1-5 GM/200ML-% IV SOLN
1000.0000 mg | Freq: Once | INTRAVENOUS | Status: AC
  Administered 2024-02-26: 1000 mg via INTRAVENOUS
  Filled 2024-02-26: qty 200

## 2024-02-26 MED ORDER — ONDANSETRON HCL 4 MG/2ML IJ SOLN: 4.0000 mg | Freq: Four times a day (QID) | INTRAMUSCULAR | Status: DC | PRN

## 2024-02-26 MED ORDER — OXYCODONE HCL 5 MG PO TABS
5.0000 mg | ORAL_TABLET | ORAL | Status: DC | PRN
  Administered 2024-02-27: 5 mg via ORAL
  Filled 2024-02-26: qty 1

## 2024-02-26 MED ORDER — SODIUM CHLORIDE 0.9% FLUSH
3.0000 mL | Freq: Two times a day (BID) | INTRAVENOUS | Status: DC
  Administered 2024-02-26 – 2024-03-03 (×7): 3 mL via INTRAVENOUS

## 2024-02-26 MED ORDER — SODIUM CHLORIDE 0.9 % IV SOLN
2.0000 g | Freq: Once | INTRAVENOUS | Status: DC
  Filled 2024-02-26: qty 12.5

## 2024-02-26 MED ORDER — VANCOMYCIN HCL IN DEXTROSE 1-5 GM/200ML-% IV SOLN
1000.0000 mg | Freq: Once | INTRAVENOUS | Status: DC
  Filled 2024-02-26: qty 200

## 2024-02-26 MED ORDER — ONDANSETRON HCL 4 MG PO TABS: 4.0000 mg | ORAL_TABLET | Freq: Four times a day (QID) | ORAL | Status: DC | PRN

## 2024-02-26 MED ORDER — GERHARDT'S BUTT CREAM
TOPICAL_CREAM | Freq: Two times a day (BID) | CUTANEOUS | Status: DC
  Filled 2024-02-26: qty 60

## 2024-02-26 MED ORDER — ACETAMINOPHEN 650 MG RE SUPP: 650.0000 mg | Freq: Four times a day (QID) | RECTAL | Status: DC | PRN

## 2024-02-26 MED ORDER — MELATONIN 5 MG PO TABS
5.0000 mg | ORAL_TABLET | Freq: Once | ORAL | Status: AC
  Administered 2024-02-27: 5 mg via ORAL
  Filled 2024-02-26 (×2): qty 1

## 2024-02-26 MED ORDER — CEFAZOLIN SODIUM-DEXTROSE 2-4 GM/100ML-% IV SOLN: 2.0000 g | Freq: Three times a day (TID) | INTRAVENOUS | Status: DC

## 2024-02-26 MED ORDER — VANCOMYCIN HCL 1500 MG/300ML IV SOLN
1500.0000 mg | Freq: Once | INTRAVENOUS | Status: AC
  Administered 2024-02-26: 1500 mg via INTRAVENOUS
  Filled 2024-02-26: qty 300

## 2024-02-26 NOTE — H&P (Addendum)
 " History and Physical    Jack Owens FMW:[id number] DOB: 08-22-1965 DOA: 02/26/2024  DOS: the patient was seen and examined on 02/26/2024  PCP: Trudy Herring, MD   Patient coming from: Home/Van  I have personally briefly reviewed patient's old medical records in [facility] and CareEverywhere  HPI:   Jack Owens is a 59 y.o. year old male with medical history of hypertension, hyperlipidemia, type 2 diabetes, severe obesity with BMI greater than 80 presenting for recurrent cellulitis.  Patient was hospitalized for cellulitis of the left thigh from 12/28 - 01/06.  He presented to his PCP office for hospital follow-up and it was noted that his left thigh had developed cellulitis.  Patient reports he has been living in a [place] and his friends backyard.  He has been unable to care for his left thigh as he was instructed due to this reason.  Patient denies any fevers or chills or URI symptoms.  He does report diarrhea over the last 3 days.  He reports he completed oral antibiotics.  He states he has been having increased urination especially after taking Lasix .  PCP sent patient to the ED for further evaluation. On arrival to the ED patient was noted to be HDS stable.  Lab work obtained.  CBC without leukocytosis, mild anemia at baseline.  There is mild thrombocytosis.  CMP overall remarkable for mild hyperglycemia.  There is slight elevation of total protein.  Lactate was checked and normal.  Blood cultures obtained.  Given need for further care, TRH contacted for admission.  Review of Systems: As mentioned in the history of present illness. All other systems reviewed and are negative.   Past Medical History:  Diagnosis Date   Depression    Diabetes mellitus without complication (HCC)    Hyperlipidemia    Hypertension     Past Surgical History:  Procedure Laterality Date   TONSILECTOMY/ADENOIDECTOMY WITH MYRINGOTOMY  as child     Allergies[1]  History reviewed. No pertinent  family history.  Prior to Admission medications  Medication Sig Start Date End Date Taking? Authorizing Provider  buPROPion  (WELLBUTRIN  XL) 150 MG 24 hr tablet Take 150 mg by mouth daily. 12/09/23   [provider]  citalopram  (CELEXA ) 40 MG tablet Take 40 mg by mouth daily. 12/09/23   [provider]  furosemide  (LASIX ) 40 MG tablet Take 1 tablet (40 mg total) by mouth daily. 02/02/24   Josette Ade, MD  lovastatin (MEVACOR) 40 MG tablet Take 40 mg by mouth at bedtime.    [provider]  Nystatin (GERHARDT'S BUTT CREAM) CREA Apply to groin and inner thigh twice a day 02/02/24   Josette Ade, MD  solifenacin (VESICARE) 10 MG tablet Take 10 mg by mouth daily. 12/09/23   [provider]    Social History:  reports that he has never smoked. He has never used smokeless tobacco. He reports that he does not drink alcohol and does not use drugs.    Physical Exam: Vitals:   02/26/24 1459 02/26/24 1500  BP:  (!) 186/91  Pulse:  92  Resp:  18  Temp:  98.3 F (36.8 C)  TempSrc:  Oral  SpO2:  98%  Weight: (!) 212.7 kg   Height: 5' 4 (1.626 m)     Gen: NAD, nontoxic appearance HENT: NCAT CV: Distant heart sounds, regular rate Lung: CTAB Abd: No TTP, significant abdominal panniculus noted MSK: No asymmetry Skin: Left lower extremity with cellulitis.  Majority is present  on posterior area.  There are superficial ulcers but no drainage noted. Neuro: alert and oriented x 4   Labs on Admission: I have personally reviewed following labs and imaging studies  CBC: Recent Labs  Lab 02/26/24 1506  WBC 9.1  NEUTROABS 5.4  HGB 12.3*  HCT 40.1  MCV 90.5  PLT 413*   Basic Metabolic Panel: Recent Labs  Lab 02/26/24 1506  NA 139  K 4.2  CL 101  CO2 29  GLUCOSE 109*  BUN 12  CREATININE 0.70  CALCIUM 9.5   GFR: Estimated Creatinine Clearance: 171.7 mL/min (by C-G formula based on SCr of 0.7 mg/dL). Liver Function Tests: Recent Labs   Lab 02/26/24 1506  AST 19  ALT 14  ALKPHOS 91  BILITOT 0.3  PROT 8.3*  ALBUMIN 4.3   No results for input(s): LIPASE, AMYLASE in the last 168 hours. No results for input(s): AMMONIA in the last 168 hours. Coagulation Profile: Recent Labs  Lab 02/26/24 1506  INR 1.0   Cardiac Enzymes: No results for input(s): CKTOTAL, CKMB, CKMBINDEX, TROPONINI, TROPONINIHS in the last 168 hours. BNP (last 3 results) No results for input(s): BNP in the last 8760 hours. HbA1C: No results for input(s): HGBA1C in the last 72 hours. CBG: No results for input(s): GLUCAP in the last 168 hours. Lipid Profile: No results for input(s): CHOL, HDL, LDLCALC, TRIG, CHOLHDL, LDLDIRECT in the last 72 hours. Thyroid Function Tests: No results for input(s): TSH, T4TOTAL, FREET4, T3FREE, THYROIDAB in the last 72 hours. Anemia Panel: No results for input(s): VITAMINB12, FOLATE, FERRITIN, TIBC, IRON, RETICCTPCT in the last 72 hours. Urine analysis:    Component Value Date/Time   COLORURINE Yellow 07/09/2011 1235   APPEARANCEUR Clear 07/09/2011 1235   LABSPEC 1.020 07/09/2011 1235   PHURINE 7.0 07/09/2011 1235   GLUCOSEU Negative 07/09/2011 1235   HGBUR Negative 07/09/2011 1235   BILIRUBINUR Negative 07/09/2011 1235   KETONESUR Negative 07/09/2011 1235   PROTEINUR Negative 07/09/2011 1235   NITRITE Negative 07/09/2011 1235   LEUKOCYTESUR Negative 07/09/2011 1235    Radiological Exams on Admission: I have personally reviewed images No results found.  EKG: My personal interpretation of EKG shows: Normal sinus rhythm without any acute ST changes.    Assessment/Plan Principal Problem:   Cellulitis Active Problems:   Chronic acquired lymphedema   Diabetes mellitus without complication (HCC)   Hyperlipidemia   Morbid obesity with BMI of 70 and over, adult [facility]) Patient with recurrent cellulitis of left thigh.  Patient reports some improvement  and discharge but states it has been worsening recently.  He was treated with cefepime  and vancomycin  and discharged on Augmentin .  Patient had imaging last admission that did not show any concerning findings but it was limited due to body habitus.  Will restart cefepime  and vancomycin  given moderate to severe cellulitis.  If it does not improve we can repeat imaging.  Blood cultures ordered.  Ordered CRP and ESR.  Hypertension: Patient significantly hypertensive blood pressure 180s over 90s.  Start amlodipine.  If patient remains hypertensive can add ACE or ARB.  Getting echocardiogram to look for CHF and depending on the findings can start GDMT.  Hyperlipidemia: Continue home statin  Type 2 diabetes: Holding home regimen.  Will start SSI.  He will be a good candidate for GLP-1 agonist therapy.  MDD: Continue home SSRI  Class III obesity: BMI greater than 80.  Patient states he loves to eat.  Discussed that current presentation is secondary to his obesity  and the weight loss will help his overall health.  GLP-1 can be considered as discussed above.  Will consult PT given lower extremity cellulitis and its impact on mobility.  Poor SDOH: Will consult TOC as patient is on homeless.  Superficial ulcer: Likely secondary to pressure ulcer.  Will place order for pressure offloading.  Will place consult for wound care. VTE prophylaxis:  SQ Heparin   Diet: Heart healthy Code Status:  Full Code Telemetry:  Admission status: Observation, Med-Surg Patient is from: Home Anticipated d/c is to: Home Anticipated d/c is in: 1-2 days   Family Communication: Updated at bedside  Consults called: None   Severity of Illness: The appropriate patient status for this patient is OBSERVATION. Observation status is judged to be reasonable and necessary in order to provide the required intensity of service to ensure the patient's safety. The patient's presenting symptoms, physical exam findings, and initial  radiographic and laboratory data in the context of their medical condition is felt to place them at decreased risk for further clinical deterioration. Furthermore, it is anticipated that the patient will be medically stable for discharge from the hospital within 2 midnights of admission.    Morene Bathe, MD [facility]. [facility]     [1] No Known Allergies  "

## 2024-02-26 NOTE — ED Triage Notes (Signed)
 Patient to ED via POV. Patient was sent by his Dr due to cellulitis of the L inner thigh. Patient was admitted in December for the same. Patient states his inner thigh continues to have drainage, causes pain, and is warm. Patient states he was unable to wrap his leg as the doctor recommended following discharge from the hospital. Patient has Hx of obesity, fluid overload, diabetes, and sleep apnea.

## 2024-02-26 NOTE — Consult Note (Addendum)
" °  CLINICAL SUPPORT TEAM - WOUND OSTOMY AND CONTINENCE TEAM  CONSULTATION SERVICES   WOC Nurse-Inpatient Note  WOC Nurse Consult Note: patient familiar to WOC team from prior admission with full thickness wound L posterior thigh  Reason for Consult: pressure wound  Wound type: 1. Stage 3 Pressure Injury L posterior thigh largely red and moist  2. Intertriginous dermatitis underneath abdominal folds/pannus erythema with scattered partial thickness skin loss per MD note  ICD-10 CM Codes for Irritant Dermatitis  L30.4  - Erythema intertrigo. Also used for abrasion of the hand, chafing of the skin, dermatitis due to sweating and friction, friction dermatitis, friction eczema, and genital/thigh intertrigo.  Pressure Injury POA: Yes Measurement: see nursing flowsheet  Wound bed: red moist minimal tan fibrinous  Drainage (amount, consistency, odor) per nursing flowsheet  Periwound: erythema, moisture damage  Dressing procedure/placement/frequency:  Cleanse L posterior thigh wound with Vashe,  do not rinse. Apply silver hydrofiber (Aquacel AG WD#[id number]/Lawson [id number]) to wound beds daily and secure with silicone foam or ABD pad and clothe tape whichever is preferred.  Cleanse underneath pannus/abdominal folds with soap and water, dry and apply Interdry AG  as follows: (Order Lawson # [id number]/WD#[id number]) Measure and cut length of InterDry to fit in skin folds that have skin breakdown Tuck InterDry fabric into skin folds in a single layer, allow for 2 inches of overhang from skin edges to allow for wicking to occur May remove to bathe; dry area thoroughly and then tuck into affected areas again Do not apply any creams or ointments when using InterDry DO NOT THROW AWAY FOR 5 DAYS unless soiled with stool DO NOT [facility] product, this will inactivate the silver in the material  New sheet of Interdry should be applied after 5 days of use if patient continues to have skin breakdown   Cleanse inner thighs/groin  with soap and water, dry and apply Gerhardt's Butt Cream 2 times daily and prn soiling.   POC discussed with bedside nurse. WOC team will not follow. Reconsult if further needs arise.    Thank you,    Sharah Finnell MSN, RN-BC, CWOCN     "

## 2024-02-26 NOTE — Plan of Care (Signed)

## 2024-02-26 NOTE — Consult Note (Signed)
 Pharmacy Antibiotic Note  ASSESSMENT: 59 y.o. male with PMH morbid obesity, homelessness, HTN, HLD, T2DM is presenting with cellulitis. Recently hospitalized for cellulitis on left thigh and he completed antibiotics during that admission (cefepime /vanc >> Augmentin /doxy). Here he endorses increased urinary frequency and diarrhea. Patient has stable vital signs and no leukocytosis. Pharmacy has been consulted to manage cefepime  and vancomycin  dosing.  PLAN: Initiate cefepime  2g IV q8H Administer vancomycin  2500mg  IV x 1 as a load, followed by 2000mg  IV q12H thereafter eAUC 506, Cmax 32, Cmin 15 Scr 0.8, IBW, Vd 0.5 (BMI 80.5) Follow up culture results to assess for antibiotic optimization. Monitor renal function to assess for any necessary antibiotic dosing changes.  Patient measurements: Height: 5' 4 (162.6 cm) Weight: (!) 212.7 kg (469 lb) IBW/kg (Calculated) : 59.2  Vital signs: Temp: 98.3 F (36.8 C) (01/30 1500) Temp Source: Oral (01/30 1500) BP: 186/91 (01/30 1500) Pulse Rate: 92 (01/30 1500) Recent Labs  Lab 02/26/24 1506  WBC 9.1  CREATININE 0.70   Estimated Creatinine Clearance: 171.7 mL/min (by C-G formula based on SCr of 0.7 mg/dL).  Allergies: Allergies[1]  Antimicrobials this admission: Cefepime  1/30 >> Vancomycin  1/30 >>  Dose adjustments this admission: n/a  Microbiology results: 1/30 BCx: sent  Thank you for allowing pharmacy to be a part of this patients care.  Will M. Lenon, PharmD, BCPS Clinical Pharmacist 02/26/2024 6:48 PM     [1] No Known Allergies

## 2024-02-26 NOTE — ED Notes (Signed)
 Devere PA in triage room during assessment. Verbal orders given for sepsis protocol.

## 2024-02-26 NOTE — ED Provider Triage Note (Signed)
 Emergency Medicine Provider Triage Evaluation Note  Jack Owens , a 59 y.o. male  was evaluated in triage.  Pt complains of cellulitis to the left inner thigh, history of the same.  He was admitted in December.  Review of Systems  Positive:  Negative:   Physical Exam  There were no vitals taken for this visit. Gen:   Awake, no distress   Resp:  Normal effort  MSK:   Moves extremities without difficulty  Other:    Medical Decision Making  Medically screening exam initiated at 2:54 PM.  Appropriate orders placed.  Tess Jama Molt was informed that the remainder of the evaluation will be completed by another provider, this initial triage assessment does not replace that evaluation, and the importance of remaining in the ED until their evaluation is complete.     Gasper Devere ORN, PA-C 02/26/24 1455

## 2024-02-26 NOTE — ED Notes (Signed)
 See triage note  Presents with swelling and possible infection to left thigh Has red area  Afebrile on arrival

## 2024-02-27 DIAGNOSIS — L03116 Cellulitis of left lower limb: Secondary | ICD-10-CM | POA: Diagnosis not present

## 2024-02-27 LAB — C DIFFICILE QUICK SCREEN W PCR REFLEX
C Diff antigen: NEGATIVE
C Diff interpretation: NOT DETECTED
C Diff toxin: NEGATIVE

## 2024-02-27 LAB — GASTROINTESTINAL PANEL BY PCR, STOOL (REPLACES STOOL CULTURE)

## 2024-02-27 LAB — CBC
HCT: 34.5 % — ABNORMAL LOW (ref 39.0–52.0)
Hemoglobin: 10.7 g/dL — ABNORMAL LOW (ref 13.0–17.0)
MCH: 27.7 pg (ref 26.0–34.0)
MCHC: 31 g/dL (ref 30.0–36.0)
MCV: 89.4 fL (ref 80.0–100.0)
Platelets: 323 10*3/uL (ref 150–400)
RBC: 3.86 MIL/uL — ABNORMAL LOW (ref 4.22–5.81)
RDW: 14.3 % (ref 11.5–15.5)
WBC: 7.4 10*3/uL (ref 4.0–10.5)
nRBC: 0 % (ref 0.0–0.2)

## 2024-02-27 LAB — BASIC METABOLIC PANEL WITH GFR
Anion gap: 11 (ref 5–15)
BUN: 10 mg/dL (ref 6–20)
CO2: 25 mmol/L (ref 22–32)
Calcium: 8.5 mg/dL — ABNORMAL LOW (ref 8.9–10.3)
Chloride: 105 mmol/L (ref 98–111)
Creatinine, Ser: 0.71 mg/dL (ref 0.61–1.24)
GFR, Estimated: >60 mL/min
Glucose, Bld: 127 mg/dL — ABNORMAL HIGH (ref 70–99)
Potassium: 4 mmol/L (ref 3.5–5.1)
Sodium: 140 mmol/L (ref 135–145)

## 2024-02-27 LAB — GLUCOSE, CAPILLARY
Glucose-Capillary: 132 mg/dL — ABNORMAL HIGH (ref 70–99)
Glucose-Capillary: 143 mg/dL — ABNORMAL HIGH (ref 70–99)
Glucose-Capillary: 148 mg/dL — ABNORMAL HIGH (ref 70–99)
Glucose-Capillary: 122 mg/dL — ABNORMAL HIGH (ref 70–99)

## 2024-02-27 LAB — C-REACTIVE PROTEIN: CRP: 2.6 mg/dL — ABNORMAL HIGH

## 2024-02-27 NOTE — Progress Notes (Signed)
 " Progress Note   Patient: Jack Owens FMW:[id number] DOB: 20-Oct-1965 DOA: 02/26/2024     0 DOS: the patient was seen and examined on 02/27/2024   Brief hospital course:  From HPI Jack Owens is a 59 y.o. year old male with medical history of hypertension, hyperlipidemia, type 2 diabetes, severe obesity with BMI greater than 80 presenting for recurrent cellulitis.  Patient was hospitalized for cellulitis of the left thigh from 12/28 - 01/06.  He presented to his PCP office for hospital follow-up and it was noted that his left thigh had developed cellulitis.  Patient reports he has been living in a [place] and his friends backyard.  He has been unable to care for his left thigh as he was instructed due to this reason.  Patient denies any fevers or chills or URI symptoms.  He does report diarrhea over the last 3 days.  He reports he completed oral antibiotics.  He states he has been having increased urination especially after taking Lasix .  PCP sent patient to the ED for further evaluation. On arrival to the ED patient was noted to be HDS stable.  Lab work obtained.  CBC without leukocytosis, mild anemia at baseline.  There is mild thrombocytosis.  CMP overall remarkable for mild hyperglycemia.  There is slight elevation of total protein.  Lactate was checked and normal.  Blood cultures obtained.  Given need for further care, TRH contacted for admission.    Assessment and Plan:  Cellulitis of the left thigh In a patient with underlying chronic lymphedema   Morbid obesity with BMI of 70 and over, adult [facility]) Patient with recurrent cellulitis of left thigh. Continue current antibiotics  Hypertension:  Continue monitoring blood pressure Continue current antihypertensives   Hyperlipidemia: Continue home statin   Type 2 diabetes: Holding home regimen.   Continue insulin  therapy   MDD: Continue home SSRI   Class III obesity: BMI greater than 80.  Patient states he loves to eat.  Discussed that  current presentation is secondary to his obesity and the weight loss will help his overall health.  GLP-1 can be considered as discussed above.  Will consult PT given lower extremity cellulitis and its impact on mobility.   Poor SDOH: Will consult TOC as patient is on homeless.   Superficial ulcer: Likely secondary to pressure ulcer.   Continue wound care  VTE prophylaxis:  SQ Heparin   Diet: Heart healthy Code Status:  Full Code    Family Communication: Updated at bedside   Consults called: None    Subjective:  Patient seen and examined at bedside this morning Admits to improvement in the left thigh redness Denies nausea abdominal pain cough  Physical Exam:  Gen: NAD, nontoxic appearance HENT: NCAT CV: Distant heart sounds, regular rate Lung: CTAB Abd: No TTP, significant abdominal panniculus noted MSK: No asymmetry Skin: Left lower extremity with cellulitis.  Majority is present on posterior area.  There are superficial ulcers but no drainage noted. Neuro: alert and oriented x 4  Vitals:   02/27/24 0427 02/27/24 0441 02/27/24 0500 02/27/24 1614  BP: 129/64   (!) 110/46  Pulse: 84   86  Resp: 17   20  Temp: (!) 95.9 F (35.5 C) 97.7 F (36.5 C)  98.2 F (36.8 C)  TempSrc:  Oral  Oral  SpO2: 95%   98%  Weight:   (!) 197 kg   Height:        Data Reviewed:    Latest Ref  Rng & Units 02/27/2024    8:09 AM 02/26/2024    3:06 PM 01/29/2024    5:20 AM  CBC  WBC 4.0 - 10.5 K/uL 7.4  9.1  6.5   Hemoglobin 13.0 - 17.0 g/dL 89.2  87.6  88.6   Hematocrit 39.0 - 52.0 % 34.5  40.1  36.8   Platelets 150 - 400 K/uL 323  413  388        Latest Ref Rng & Units 02/27/2024    8:09 AM 02/26/2024    3:06 PM 02/01/2024    6:07 AM  BMP  Glucose 70 - 99 mg/dL 872  890  878   BUN 6 - 20 mg/dL 10  12  27    Creatinine 0.61 - 1.24 mg/dL 9.28  9.29  9.06   Sodium 135 - 145 mmol/L 140  139  138   Potassium 3.5 - 5.1 mmol/L 4.0  4.2  4.9   Chloride 98 - 111 mmol/L 105  101  97   CO2 22  - 32 mmol/L 25  29  32   Calcium 8.9 - 10.3 mg/dL 8.5  9.5  9.9      Author: Drue ONEIDA Potter, MD 02/27/2024 6:55 PM  For on call review [email or web address].  "

## 2024-02-27 NOTE — Evaluation (Signed)
 Physical Therapy Evaluation Patient Details Name: Jack Owens MRN: [id number] DOB: 1965/10/17 Today's Date: 02/27/2024  History of Present Illness  Pt admitted for recurrent cellulitis. PMH includes HTN, HLD, DM and recent hospital stay from 12/28-1/06.  Clinical Impression  Pt is a pleasant 59 year old male who was admitted for recurrent cellulitis. Pt performs bed mobility with mod I and able to sit at EOB for extended time to eat lunch with heavy reliance on bed features. Pt reports no recent falls but has been having a hard time with mobility and is worried his legs will give out on him. Encouraged to use walker for assistance, will need BRW (none in room at this time). Pt demonstrates deficits with strength/mobility/balance. Would benefit from skilled PT to address above deficits and promote optimal return to PLOF. Pt will continue to receive skilled PT services while admitted and will defer to TOC/care team for updates regarding disposition planning. Will keep on caseload to prevent deconditioning and progress confidence with functional independence.       If plan is discharge home, recommend the following: A little help with walking and/or transfers;A little help with bathing/dressing/bathroom;Assist for transportation;Help with stairs or ramp for entrance   Can travel by private vehicle        Equipment Recommendations  (needs bariatric rollator)  Recommendations for Other Services       Functional Status Assessment Patient has had a recent decline in their functional status and demonstrates the ability to make significant improvements in function in a reasonable and predictable amount of time.     Precautions / Restrictions Precautions Precautions: Fall Recall of Precautions/Restrictions: Intact Restrictions Weight Bearing Restrictions Per Provider Order: No      Mobility  Bed Mobility Overal bed mobility: Modified Independent             General bed mobility  comments: heavy reliance on railing to come to seated position. Typically sleep in car in upright position. ONce seated at EOB, upright posture maintained. Pt currently eating lunch, declined further mobility, however documented as ambulating in room with RN staff which pt confirms.    Transfers                   General transfer comment: not performed as pt currently eating lunch    Ambulation/Gait                  Stairs            Wheelchair Mobility     Tilt Bed    Modified Rankin (Stroke Patients Only)       Balance Overall balance assessment: Modified Independent                                           Pertinent Vitals/Pain Pain Assessment Pain Assessment: 0-10 Pain Score: 2  Pain Location: R ant knee Pain Descriptors / Indicators: Aching Pain Intervention(s): Limited activity within patient's tolerance    Home Living Family/patient expects to be discharged to:: Shelter/Homeless                   Additional Comments: wants assisted living. Reports his daughter who lives locally is working on getting him qualified for disability. 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    Prior Function Prior Level of Function : Needs assist  Mobility Comments: ambulates short distances with independence, however reports he is reaching out for furniture. reports no falls and has BRW available if needed ADLs Comments: indep with ADLs, able to take bird bath     Extremity/Trunk Assessment   Upper Extremity Assessment Upper Extremity Assessment: Overall WFL for tasks assessed    Lower Extremity Assessment Lower Extremity Assessment: Generalized weakness       Communication   Communication Communication: No apparent difficulties    Cognition Arousal: Alert Behavior During Therapy: WFL for tasks assessed/performed   PT - Cognitive impairments: No apparent impairments                        PT - Cognition Comments: alert and pleasant, agreeable to session Following commands: Intact       Cueing Cueing Techniques: Verbal cues     General Comments      Exercises     Assessment/Plan    PT Assessment Patient needs continued PT services  PT Problem List Decreased strength;Decreased activity tolerance;Decreased balance;Decreased knowledge of precautions;Obesity       PT Treatment Interventions DME instruction;Gait training;Therapeutic exercise;Balance training    PT Goals (Current goals can be found in the Care Plan section)  Acute Rehab PT Goals Patient Stated Goal: to go to ALF PT Goal Formulation: With patient Time For Goal Achievement: 03/12/24 Potential to Achieve Goals: Good    Frequency Min 2X/week     Co-evaluation               AM-PAC PT 6 Clicks Mobility  Outcome Measure Help needed turning from your back to your side while in a flat bed without using bedrails?: A Little Help needed moving from lying on your back to sitting on the side of a flat bed without using bedrails?: A Little Help needed moving to and from a bed to a chair (including a wheelchair)?: A Little Help needed standing up from a chair using your arms (e.g., wheelchair or bedside chair)?: A Little Help needed to walk in hospital room?: A Lot Help needed climbing 3-5 steps with a railing? : A Lot 6 Click Score: 16    End of Session   Activity Tolerance: Patient tolerated treatment well Patient left: in bed Nurse Communication: Mobility status PT Visit Diagnosis: Muscle weakness (generalized) (M62.81);Difficulty in walking, not elsewhere classified (R26.2);Unsteadiness on feet (R26.81)    Time: 8698-8681 PT Time Calculation (min) (ACUTE ONLY): 17 min   Charges:   PT Evaluation $PT Eval Low Complexity: 1 Low   PT General Charges $$ ACUTE PT VISIT: 1 Visit         Corean Dade, PT, DPT, GCS [phone]   [place] Brigance 02/27/2024, 2:18 PM

## 2024-02-27 NOTE — Progress Notes (Signed)
" °   02/27/24 1405  TOC Brief Assessment  Insurance and Status Reviewed  Patient has primary care physician Yes WORLEY, [facility])  Home environment has been reviewed Pt reports Unhoused  Prior level of function: Independent  Prior/Current Home Services No current home services  Social Drivers of Health Review SDOH reviewed interventions complete  Readmission risk has been reviewed Yes  Transition of care needs transition of care needs identified, TOC will continue to follow   Resources added to AVS. "

## 2024-02-28 ENCOUNTER — Inpatient Hospital Stay: Admit: 2024-02-28 | Discharge: 2024-02-28 | Disposition: A | Attending: Internal Medicine

## 2024-02-28 DIAGNOSIS — L03116 Cellulitis of left lower limb: Secondary | ICD-10-CM | POA: Diagnosis not present

## 2024-02-28 LAB — ECHOCARDIOGRAM COMPLETE
Area-P 1/2: 3.45 cm2
Height: 64 in
S' Lateral: 3.7 cm
Weight: 6987.7 [oz_av]

## 2024-02-28 LAB — GLUCOSE, CAPILLARY
Glucose-Capillary: 151 mg/dL — ABNORMAL HIGH (ref 70–99)
Glucose-Capillary: 129 mg/dL — ABNORMAL HIGH (ref 70–99)
Glucose-Capillary: 146 mg/dL — ABNORMAL HIGH (ref 70–99)
Glucose-Capillary: 128 mg/dL — ABNORMAL HIGH (ref 70–99)

## 2024-02-28 MED ORDER — PERFLUTREN LIPID MICROSPHERE
1.0000 mL | INTRAVENOUS | Status: AC | PRN
  Administered 2024-02-28: 5 mL via INTRAVENOUS

## 2024-02-28 MED ORDER — MELATONIN 5 MG PO TABS
5.0000 mg | ORAL_TABLET | Freq: Every evening | ORAL | Status: AC | PRN
  Administered 2024-02-28 – 2024-03-02 (×4): 5 mg via ORAL
  Filled 2024-02-28 (×4): qty 1

## 2024-02-28 NOTE — Progress Notes (Signed)
 " Progress Note   Patient: Jack Owens FMW:[id number] DOB: 23-Aug-1965 DOA: 02/26/2024     1 DOS: the patient was seen and examined on 02/28/2024     Brief hospital course:  From HPI Jack Owens is a 59 y.o. year old male with medical history of hypertension, hyperlipidemia, type 2 diabetes, severe obesity with BMI greater than 80 presenting for recurrent cellulitis.  Patient was hospitalized for cellulitis of the left thigh from 12/28 - 01/06.  He presented to his PCP office for hospital follow-up and it was noted that his left thigh had developed cellulitis.  Patient reports he has been living in a [place] and his friends backyard.  He has been unable to care for his left thigh as he was instructed due to this reason.  Patient denies any fevers or chills or URI symptoms.  He does report diarrhea over the last 3 days.  He reports he completed oral antibiotics.  He states he has been having increased urination especially after taking Lasix .  PCP sent patient to the ED for further evaluation. On arrival to the ED patient was noted to be HDS stable.  Lab work obtained.  CBC without leukocytosis, mild anemia at baseline.  There is mild thrombocytosis.  CMP overall remarkable for mild hyperglycemia.  There is slight elevation of total protein.  Lactate was checked and normal.  Blood cultures obtained.  Given need for further care, TRH contacted for admission.     Assessment and Plan:   Cellulitis of the left thigh In a patient with underlying chronic lymphedema   Morbid obesity with BMI of 70 and over, adult [facility]) Patient with recurrent cellulitis of left thigh. Continue current antibiotics Continue PT OT   Hypertension:  Continue monitoring blood pressure Continue current antihypertensives   Hyperlipidemia: Continue home statin   Type 2 diabetes: Holding home regimen.   Continue insulin  therapy   MDD: Continue home SSRI   Class III obesity: BMI greater than 80.  Patient states he loves  to eat.  Discussed that current presentation is secondary to his obesity and the weight loss will help his overall health.  GLP-1 can be considered as discussed above.  Will consult PT given lower extremity cellulitis and its impact on mobility.   Poor SDOH: Will consult TOC as patient is on homeless.   Superficial ulcer: Likely secondary to pressure ulcer.   Continue wound care   VTE prophylaxis:  SQ Heparin   Diet: Heart healthy Code Status:  Full Code     Family Communication: Updated at bedside   Consults called: None     Subjective:  Patient seen and examined at bedside this morning Denies acute overnight complaints    Physical Exam:   Gen: NAD, nontoxic appearance HENT: NCAT CV: Distant heart sounds, regular rate Lung: CTAB Abd: No TTP, significant abdominal panniculus noted MSK: No asymmetry Skin: Left lower extremity with cellulitis.  Majority is present on posterior area.  There are superficial ulcers but no drainage noted. Neuro: alert and oriented x 4  Data Reviewed:     Latest Ref Rng & Units 02/27/2024    8:09 AM 02/26/2024    3:06 PM 01/29/2024    5:20 AM  CBC  WBC 4.0 - 10.5 K/uL 7.4  9.1  6.5   Hemoglobin 13.0 - 17.0 g/dL 89.2  87.6  88.6   Hematocrit 39.0 - 52.0 % 34.5  40.1  36.8   Platelets 150 - 400 K/uL 323  413  388        Latest Ref Rng & Units 02/27/2024    8:09 AM 02/26/2024    3:06 PM 02/01/2024    6:07 AM  BMP  Glucose 70 - 99 mg/dL 872  890  878   BUN 6 - 20 mg/dL 10  12  27    Creatinine 0.61 - 1.24 mg/dL 9.28  9.29  9.06   Sodium 135 - 145 mmol/L 140  139  138   Potassium 3.5 - 5.1 mmol/L 4.0  4.2  4.9   Chloride 98 - 111 mmol/L 105  101  97   CO2 22 - 32 mmol/L 25  29  32   Calcium 8.9 - 10.3 mg/dL 8.5  9.5  9.9     Vitals:   02/28/24 0325 02/28/24 0500 02/28/24 0737 02/28/24 1505  BP: 119/71  (!) 114/59 (!) 139/45  Pulse: 74  72 70  Resp: 18  19 17   Temp: (!) 97.3 F (36.3 C)  98 F (36.7 C) 98.2 F (36.8 C)  TempSrc:   (P)  Oral   SpO2: 98%  97% 97%  Weight:  (!) 198.1 kg    Height:         Author: Drue ONEIDA Potter, MD 02/28/2024 5:21 PM  For on call review [email or web address].  "

## 2024-02-28 NOTE — Plan of Care (Signed)
   Problem: Education: Goal: Ability to describe self-care measures that may prevent or decrease complications (Diabetes Survival Skills Education) will improve Outcome: Progressing

## 2024-02-28 NOTE — Progress Notes (Signed)
" °  Echocardiogram 2D Echocardiogram has been performed. Definity  IV ultrasound imaging agent used on this study.  Thedora GORMAN Louder 02/28/2024, 11:54 AM "

## 2024-02-28 NOTE — Plan of Care (Signed)
  Problem: Tissue Perfusion: Goal: Adequacy of tissue perfusion will improve Outcome: Progressing   Problem: Education: Goal: Knowledge of General Education information will improve Description: Including pain rating scale, medication(s)/side effects and non-pharmacologic comfort measures Outcome: Progressing   Problem: Health Behavior/Discharge Planning: Goal: Ability to manage health-related needs will improve Outcome: Progressing

## 2024-02-29 DIAGNOSIS — L03116 Cellulitis of left lower limb: Secondary | ICD-10-CM | POA: Diagnosis not present

## 2024-02-29 LAB — GLUCOSE, CAPILLARY
Glucose-Capillary: 105 mg/dL — ABNORMAL HIGH (ref 70–99)
Glucose-Capillary: 134 mg/dL — ABNORMAL HIGH (ref 70–99)
Glucose-Capillary: 135 mg/dL — ABNORMAL HIGH (ref 70–99)
Glucose-Capillary: 136 mg/dL — ABNORMAL HIGH (ref 70–99)

## 2024-02-29 NOTE — Plan of Care (Signed)
" °  Problem: Education: Goal: Ability to describe self-care measures that may prevent or decrease complications (Diabetes Survival Skills Education) will improve Outcome: Progressing   Problem: Coping: Goal: Ability to adjust to condition or change in health will improve Outcome: Progressing   Problem: Metabolic: Goal: Ability to maintain appropriate glucose levels will improve Outcome: Progressing   Problem: Clinical Measurements: Goal: Respiratory complications will improve Outcome: Progressing   Problem: Activity: Goal: Risk for activity intolerance will decrease Outcome: Progressing   Problem: Coping: Goal: Level of anxiety will decrease Outcome: Progressing   Problem: Elimination: Goal: Will not experience complications related to bowel motility Outcome: Progressing Goal: Will not experience complications related to urinary retention Outcome: Progressing   "

## 2024-03-01 DIAGNOSIS — L03116 Cellulitis of left lower limb: Secondary | ICD-10-CM | POA: Diagnosis not present

## 2024-03-01 LAB — BASIC METABOLIC PANEL WITH GFR
Anion gap: 11 (ref 5–15)
BUN: 15 mg/dL (ref 6–20)
CO2: 29 mmol/L (ref 22–32)
Calcium: 9.4 mg/dL (ref 8.9–10.3)
Chloride: 99 mmol/L (ref 98–111)
Creatinine, Ser: 0.75 mg/dL (ref 0.61–1.24)
GFR, Estimated: >60 mL/min
Glucose, Bld: 166 mg/dL — ABNORMAL HIGH (ref 70–99)
Potassium: 5 mmol/L (ref 3.5–5.1)
Sodium: 139 mmol/L (ref 135–145)

## 2024-03-01 LAB — CBC WITH DIFFERENTIAL/PLATELET
Abs Immature Granulocytes: 0.04 10*3/uL (ref 0.00–0.07)
Basophils Absolute: 0.1 10*3/uL (ref 0.0–0.1)
Basophils Relative: 1 %
Eosinophils Absolute: 0.4 10*3/uL (ref 0.0–0.5)
Eosinophils Relative: 6 %
HCT: 36.8 % — ABNORMAL LOW (ref 39.0–52.0)
Hemoglobin: 11.3 g/dL — ABNORMAL LOW (ref 13.0–17.0)
Immature Granulocytes: 1 %
Lymphocytes Relative: 27 %
Lymphs Abs: 1.9 10*3/uL (ref 0.7–4.0)
MCH: 27.9 pg (ref 26.0–34.0)
MCHC: 30.7 g/dL (ref 30.0–36.0)
MCV: 90.9 fL (ref 80.0–100.0)
Monocytes Absolute: 0.7 10*3/uL (ref 0.1–1.0)
Monocytes Relative: 10 %
Neutro Abs: 4 10*3/uL (ref 1.7–7.7)
Neutrophils Relative %: 55 %
Platelets: 339 10*3/uL (ref 150–400)
RBC: 4.05 MIL/uL — ABNORMAL LOW (ref 4.22–5.81)
RDW: 14.3 % (ref 11.5–15.5)
WBC: 7.1 10*3/uL (ref 4.0–10.5)
nRBC: 0 % (ref 0.0–0.2)

## 2024-03-01 LAB — GLUCOSE, CAPILLARY
Glucose-Capillary: 126 mg/dL — ABNORMAL HIGH (ref 70–99)
Glucose-Capillary: 134 mg/dL — ABNORMAL HIGH (ref 70–99)
Glucose-Capillary: 98 mg/dL (ref 70–99)

## 2024-03-01 MED ORDER — ESCITALOPRAM OXALATE 10 MG PO TABS
20.0000 mg | ORAL_TABLET | Freq: Every day | ORAL | Status: DC
  Administered 2024-03-01 – 2024-03-03 (×3): 20 mg via ORAL
  Filled 2024-03-01 (×3): qty 2

## 2024-03-01 NOTE — TOC Progression Note (Signed)
 Transition of Care [facility]) - Progression Note    Patient Details  Name: Jack Owens MRN: [id number] Date of Birth: 05/24/1965  Transition of Care [facility]) CM/SW Contact  Auna Mikkelsen  [place], [place] Phone Number: 03/01/2024, 4:02 PM  Clinical Narrative:   [facility] faxed patient out for SNF [facility] as they are one of the only facilities that takes patients Medicaid. They indicated that they cannot take the patient. [facility] pivoted to OP Rehab and patient declined.   TOC to follow discharge      Barriers to Discharge: Continued Medical Work up               Expected Discharge Plan and Services                                               Social Drivers of Health (SDOH) Interventions SDOH Screenings   Food Insecurity: Food Insecurity Present (02/26/2024)  Housing: High Risk (02/26/2024)  Transportation Needs: No Transportation Needs (02/26/2024)  Utilities: Not At Risk (02/26/2024)  Social Connections: Moderately Isolated (02/26/2024)  Tobacco Use: Low Risk (02/26/2024)    Readmission Risk Interventions     No data to display

## 2024-03-01 NOTE — TOC CM/SW Note (Signed)
 Transition of Care (TOC) CM/SW Note   To Whom It May Concern:   Please be advised that the above-named patient will require a short-term nursing home stay - anticipated 30 days or less for rehabilitation and strengthening.  The plan is for return home

## 2024-03-01 NOTE — Plan of Care (Signed)
  Problem: Education: Goal: Ability to describe self-care measures that may prevent or decrease complications (Diabetes Survival Skills Education) will improve Outcome: Progressing   Problem: Coping: Goal: Ability to adjust to condition or change in health will improve Outcome: Progressing

## 2024-03-01 NOTE — Plan of Care (Signed)
  Problem: Education: Goal: Ability to describe self-care measures that may prevent or decrease complications (Diabetes Survival Skills Education) will improve Outcome: Progressing   Problem: Coping: Goal: Ability to adjust to condition or change in health will improve Outcome: Progressing   Problem: Nutritional: Goal: Maintenance of adequate nutrition will improve Outcome: Progressing   Problem: Nutrition: Goal: Adequate nutrition will be maintained Outcome: Progressing

## 2024-03-01 NOTE — Progress Notes (Signed)
 Pt refused VS checks, CBG checks and all his nighttime meds including IV antibiotics and heparin  shot. Explained the risk for developing blood clots and resistance to antibiotics with refusal. Pt verbalized understanding but says he'll still not take it. Pt alert and oriented. Hospitalist K. Foust, NP informed.

## 2024-03-02 LAB — CULTURE, BLOOD (ROUTINE X 2)
Culture: NO GROWTH
Culture: NO GROWTH
Special Requests: ADEQUATE
Special Requests: ADEQUATE

## 2024-03-02 LAB — CBC WITH DIFFERENTIAL/PLATELET
Abs Immature Granulocytes: 0.05 10*3/uL (ref 0.00–0.07)
Basophils Absolute: 0.1 10*3/uL (ref 0.0–0.1)
Basophils Relative: 1 %
Eosinophils Absolute: 0.4 10*3/uL (ref 0.0–0.5)
Eosinophils Relative: 5 %
HCT: 37.8 % — ABNORMAL LOW (ref 39.0–52.0)
Hemoglobin: 11.6 g/dL — ABNORMAL LOW (ref 13.0–17.0)
Immature Granulocytes: 1 %
Lymphocytes Relative: 21 %
Lymphs Abs: 1.6 10*3/uL (ref 0.7–4.0)
MCH: 27.7 pg (ref 26.0–34.0)
MCHC: 30.7 g/dL (ref 30.0–36.0)
MCV: 90.2 fL (ref 80.0–100.0)
Monocytes Absolute: 0.7 10*3/uL (ref 0.1–1.0)
Monocytes Relative: 9 %
Neutro Abs: 4.8 10*3/uL (ref 1.7–7.7)
Neutrophils Relative %: 63 %
Platelets: 359 10*3/uL (ref 150–400)
RBC: 4.19 MIL/uL — ABNORMAL LOW (ref 4.22–5.81)
RDW: 14 % (ref 11.5–15.5)
WBC: 7.5 10*3/uL (ref 4.0–10.5)
nRBC: 0 % (ref 0.0–0.2)

## 2024-03-02 LAB — BASIC METABOLIC PANEL WITH GFR
Anion gap: 10 (ref 5–15)
BUN: 18 mg/dL (ref 6–20)
CO2: 32 mmol/L (ref 22–32)
Calcium: 9.6 mg/dL (ref 8.9–10.3)
Chloride: 97 mmol/L — ABNORMAL LOW (ref 98–111)
Creatinine, Ser: 0.75 mg/dL (ref 0.61–1.24)
GFR, Estimated: >60 mL/min
Glucose, Bld: 179 mg/dL — ABNORMAL HIGH (ref 70–99)
Potassium: 5 mmol/L (ref 3.5–5.1)
Sodium: 139 mmol/L (ref 135–145)

## 2024-03-02 LAB — GLUCOSE, CAPILLARY
Glucose-Capillary: 114 mg/dL — ABNORMAL HIGH (ref 70–99)
Glucose-Capillary: 134 mg/dL — ABNORMAL HIGH (ref 70–99)
Glucose-Capillary: 120 mg/dL — ABNORMAL HIGH (ref 70–99)
Glucose-Capillary: 111 mg/dL — ABNORMAL HIGH (ref 70–99)

## 2024-03-02 MED ORDER — BUPROPION HCL ER (XL) 150 MG PO TB24
150.0000 mg | ORAL_TABLET | Freq: Every day | ORAL | Status: DC
  Administered 2024-03-02 – 2024-03-03 (×2): 150 mg via ORAL
  Filled 2024-03-02 (×2): qty 1

## 2024-03-02 MED ORDER — CEFADROXIL 500 MG PO CAPS
1000.0000 mg | ORAL_CAPSULE | Freq: Two times a day (BID) | ORAL | Status: DC
  Administered 2024-03-02 – 2024-03-03 (×3): 1000 mg via ORAL
  Filled 2024-03-02 (×3): qty 2

## 2024-03-02 NOTE — Progress Notes (Signed)
 " Progress Note   Patient: Jack Owens FMW:[id number] DOB: Dec 04, 1965 DOA: 02/26/2024     4 DOS: the patient was seen and examined on 03/02/2024     Brief hospital course:  From HPI Jack Owens is a 59 y.o. year old male with medical history of hypertension, hyperlipidemia, type 2 diabetes, severe obesity with BMI greater than 80 presenting for recurrent cellulitis.  Patient was hospitalized for cellulitis of the left thigh from 12/28 - 01/06.  He presented to his PCP office for hospital follow-up and it was noted that his left thigh had developed cellulitis.  Patient reports he has been living in a [place] and his friends backyard.  He has been unable to care for his left thigh as he was instructed due to this reason.  Patient denies any fevers or chills or URI symptoms.  He does report diarrhea over the last 3 days.  He reports he completed oral antibiotics.  He states he has been having increased urination especially after taking Lasix .  PCP sent patient to the ED for further evaluation. On arrival to the ED patient was noted to be HDS stable.  Lab work obtained.  CBC without leukocytosis, mild anemia at baseline.  There is mild thrombocytosis.  CMP overall remarkable for mild hyperglycemia.  There is slight elevation of total protein.  Lactate was checked and normal.  Blood cultures obtained.  Given need for further care, TRH contacted for admission.    2/4: Hemodynamically stable, refusing IV antibiotics and missed yesterday's dose, after discussing with ID pharmacist, antibiotic switched with cefadroxil  for 3 more days to complete a total of 7-day course.  Clinical evidence of cellulitis improved. Unable to obtain any placement.  PT is recommending outpatient therapy.  Patient is homeless, shelter resources were provided.  Keeping for 1 more day at his request due to inclement weather tonight.   Assessment and Plan:   Cellulitis of the left thigh In a patient with underlying chronic  lymphedema   Morbid obesity with BMI of 70 and over, adult [facility]) Patient with recurrent cellulitis of left thigh. IV antibiotics switched with p.o. cefadroxil  to complete a 7-day course. Continue PT OT Homeless shelter resources were provided, as he did not qualify for SNF.   Homelessness TOC on board   Hypertension:  Continue monitoring blood pressure Continue current antihypertensives   Hyperlipidemia: Continue home statin   Type 2 diabetes: Holding home regimen.   Continue insulin  therapy   MDD: Continue home SSRI   Class III obesity: BMI greater than 80.   Patient states he loves to eat.   Discussed that current presentation is secondary to his obesity and the weight loss will help his overall health.       Superficial ulcer: Likely secondary to pressure ulcer.   Continue wound care   VTE prophylaxis:  SQ Heparin   Diet: Heart healthy Code Status:  Full Code     Family Communication: Updated at bedside   Consults called: None     Subjective:  Patient was seen and examined today.  Stating that he still has some pain in left leg, does not want to turn around for exam. He was frustrated that he cannot go to a nursing home.   Physical Exam:   General.  Morbidly obese gentleman, in no acute distress. Pulmonary.  Lungs clear bilaterally, normal respiratory effort. CV.  Regular rate and rhythm, no JVD, rub or murmur. Abdomen.  Soft, nontender, nondistended, BS positive. CNS.  Alert  and oriented .  No focal neurologic deficit. Extremities.  No LE pitting edema,pulses intact and symmetrical.  Chronic lymphedema Psychiatry.  Judgment and insight appears normal.    Data Reviewed:        Latest Ref Rng & Units 03/02/2024   10:51 AM 03/01/2024    4:50 AM 02/27/2024    8:09 AM  CBC  WBC 4.0 - 10.5 K/uL 7.5  7.1  7.4   Hemoglobin 13.0 - 17.0 g/dL 88.3  88.6  89.2   Hematocrit 39.0 - 52.0 % 37.8  36.8  34.5   Platelets 150 - 400 K/uL 359  339  323        Latest Ref  Rng & Units 03/02/2024   10:51 AM 03/01/2024    4:50 AM 02/27/2024    8:09 AM  BMP  Glucose 70 - 99 mg/dL 820  833  872   BUN 6 - 20 mg/dL 18  15  10    Creatinine 0.61 - 1.24 mg/dL 9.24  9.24  9.28   Sodium 135 - 145 mmol/L 139  139  140   Potassium 3.5 - 5.1 mmol/L 5.0  5.0  4.0   Chloride 98 - 111 mmol/L 97  99  105   CO2 22 - 32 mmol/L 32  29  25   Calcium 8.9 - 10.3 mg/dL 9.6  9.4  8.5       Vitals:   03/01/24 0834 03/01/24 1714 03/02/24 0500 03/02/24 0854  BP: (!) 117/59 (!) 143/72  (!) 125/56  Pulse: 64 77  71  Resp: 17 16  17   Temp: 98.1 F (36.7 C)     TempSrc:      SpO2: 99% 98%  95%  Weight:   (!) 192.8 kg   Height:         Author: Amaryllis Dare, MD 03/02/2024 1:51 PM  For on call review [email or web address].  "

## 2024-03-02 NOTE — Progress Notes (Signed)
 Patient refused Heparin  shots and IV Antibiotics. Discussed the risk for developing blood clots and worsening infection and potential complications.Patient verbalized understanding and still declining. Provider notified.

## 2024-03-02 NOTE — TOC CM/SW Note (Incomplete)
 Transition of Care [facility]) CM/SW Note   Occupational Therapy * Physical Therapy * Speech Therapy  DATE 03/03/24 PATIENT NAME   Jack Owens  PATIENT MRN    [id number]   DIAGNOSIS/DIAGNOSIS CODE     L03.90   DATE OF DISCHARGE 03/03/24  PRIMARY CARE PHYSICIAN Maurilio Pouch  PCP PHONE/FAX   [phone]  Dear Provider    I certify that I have examined this patient and that occupational/physical/speech therapy is necessary on an outpatient basis.    The patient has expressed interest in completing their recommended course of therapy at your location.  Once a formal order from the patient's primary care physician has been obtained, please contact him/her to schedule an appointment for evaluation at your earliest convenience.  [ x ]  Physical Therapy Evaluate and Treat  [ x ]  Occupational Therapy Evaluate and Treat  [  ]  Speech Therapy Evaluate and Treat  The patient's primary care physician (listed above) must furnish and be responsible for a formal order such that the recommended services may be furnished while under the primary physician's care, and that the plan of care will be established and reviewed every 30 days (or more often if condition necessitates).

## 2024-03-02 NOTE — Discharge Instructions (Signed)
 Rent/Utility/Housing  Agency Name: [facility] Agency Address: [address] [place], [place] [address] Phone: [phone] Email: [email or web address] Website: [email or web address] Service(s) Offered: Housing services, self-sufficiency, congregate meal program, weatherization program, Field seismologist program, emergency food assistance,  housing counseling, home ownership program, wheels -towork program.  Agency Name: [facility] Address: [address] [place], [place] [address] Phone: [phone] (8a-4p) [phone] (8p- 10p) Email: [email or web address] Website: [email or web address] Service(s) Offered: A program for homeless and/or needy men that includes one-on-one counseling, life skills training and job rehabilitation.  Agency Name: [facility] [place] Address: [address] [place], [place] [address] Phone: [phone] Website: www.alliedchurches.org Service(s) Offered: Assistance to needy in emergency with utility bills, heating fuel, and prescriptions. Shelter for homeless 7pm-7am. May 22, 2016 15  Agency Name: [facility] [place] [facility] Dis[facility]) Address: [address], [place], [place] [address] Phone: [phone] Contact Person: Cathleen Corti Email: [email or web address] Website: [email or web address].[email or web address] Service(s) Offered: Helps individuals with developmental disabilities move from housing that is more restrictive to homes where they  can achieve greater independence and have more  opportunities.  Agency Name: [facility] Address: [address], [place], [place] [address] Phone: [phone] Email: [email or web address] Website: www.[email or web address].org Service(s) Offered: Provides affordable housing for low-income families, elderly, and disabled individuals. Offer a wide range of  programs and services, from financial planning to  afterschool and summer programs.  Agency Name: [facility] [employer] Address: [address] [place], [place] [address] Phone: [phone] Service(s) Offered: Child support services; child welfare services; food stamps; Medicaid; work first family assistance; and aid with fuel,  rent, food and medicine.  Agency Name: [facility] [place] [employer]. Address: [facility] [address], [place], [place]  [address] Phone: [phone] Website: www.familyabuseservices.org Service(s) Offered: 24 hour Crisis Line: [phone]; 24 hour Emergency Shelter; Transitional Housing; Support Groups; Scientist, physiological; [employer]; Hispanic Outreach: [phone];  Visitation Center: [phone].  Agency Name: [facility] [place]. Address: [address] [place], [place] [address] Phone: [phone] Service(s) Offered: CAP Services; Home and [employer]; Individual or Group Supports; Respite Care Non-Institutional Nursing;  Residential Supports; Respite Care and Personal Care Services; Transportation; Family and Friends Night; Recreational Activities; Three Nutritious Meals/Snacks; Consultation with Registered Dietician; Twenty-four hour Registered Nurse Access; Daily and [employer]; Camp [employer]aves; [place] for the [employer] (During Summer Months) Bingo Night (Every  Wednesday Night); Special Populations Dance Night  (Every Tuesday Night); Professional Hair Care Services.  Agency Name: [facility] Address: [address], [place], [place] [address] Phone: [phone] Contact Person: Jabier Mutton Website: [email or web address].Physicist, medical) Offered: Residential treatment facility for women; food and  clothing, educational & employment development and  transportation to work; Counsellor of financial skills;  parenting and family reunification; emotional and spiritual  support;  transitional housing for program graduates.  Agency Name: [facility] Address: [address] [place], [place] [address] Phone: [phone] Email: [email or web address] Website: TaskTown.es Service(s) Offered: Public housing units for elderly, disabled, and low income people; housing choice vouchers for income eligible  applicants; shelter plus care vouchers; and Psychologist, clinical.  Agency Name: [facility] [facility] [facility] [employer] Address: [address] [place], [place] [address] Phone: [phone] Email: habitat1@[email or web address] .net Website: www.habitatalamance.org Service(s) Offered: Build houses for families in need of decent housing. Each adult in the family must invest 200 hours of labor on  someone else's house, work with volunteers to build their own house, attend classes  on budgeting, home maintenance, yard care, and attend homeowner association meetings.  Agency Name: [facility] Address: [address], [place], [place] [address] Phone: [phone] Website: www.rsli.org Service(s) Offered: Intermediate care facilities for intellectually delayed, Supervised Living in group homes for adults with developmental disabilities, Supervised Living for people who have dual diagnoses (MRMI), Independent Living, Supported Living, respite and a variety of CAP services, pre-vocational services, day supports, and [employer].  Agency Name: [place] [employer] Phone: [phone] Website: www.NCForeclosurePrevention.gov Service(s) Offered: Zero-interest, deferred loans to homeowners struggling to pay their mortgage. Call for more information.

## 2024-03-02 NOTE — Plan of Care (Signed)
   Problem: Education: Goal: Ability to describe self-care measures that may prevent or decrease complications (Diabetes Survival Skills Education) will improve Outcome: Progressing   Problem: Coping: Goal: Ability to adjust to condition or change in health will improve Outcome: Progressing   Problem: Fluid Volume: Goal: Ability to maintain a balanced intake and output will improve Outcome: Progressing

## 2024-03-02 NOTE — Plan of Care (Signed)
 Pt refused treatment plan. MD informed.  Problem: Metabolic: Goal: Ability to maintain appropriate glucose levels will improve Outcome: Not Progressing   Problem: Skin Integrity: Goal: Risk for impaired skin integrity will decrease Outcome: Not Progressing   Problem: Clinical Measurements: Goal: Ability to avoid or minimize complications of infection will improve Outcome: Not Progressing   Problem: Skin Integrity: Goal: Skin integrity will improve Outcome: Not Progressing

## 2024-03-03 ENCOUNTER — Other Ambulatory Visit: Payer: Self-pay

## 2024-03-03 LAB — CREATININE, SERUM
Creatinine, Ser: 0.68 mg/dL (ref 0.61–1.24)
GFR, Estimated: >60 mL/min

## 2024-03-03 LAB — GLUCOSE, CAPILLARY: Glucose-Capillary: 192 mg/dL — ABNORMAL HIGH (ref 70–99)

## 2024-03-03 MED ORDER — ESCITALOPRAM OXALATE 20 MG PO TABS
20.0000 mg | ORAL_TABLET | Freq: Every day | ORAL | 1 refills | Status: AC
  Filled 2024-03-03: qty 90, 90d supply, fill #0

## 2024-03-03 MED ORDER — ACETAMINOPHEN 325 MG PO TABS
650.0000 mg | ORAL_TABLET | Freq: Four times a day (QID) | ORAL | 0 refills | Status: AC | PRN
  Filled 2024-03-03: qty 100, 13d supply, fill #0

## 2024-03-03 MED ORDER — CEFADROXIL 500 MG PO CAPS
1000.0000 mg | ORAL_CAPSULE | Freq: Two times a day (BID) | ORAL | 0 refills | Status: AC
  Filled 2024-03-03: qty 14, 4d supply, fill #0

## 2024-03-03 NOTE — Plan of Care (Signed)
" °  Problem: Fluid Volume: Goal: Ability to maintain a balanced intake and output will improve Outcome: Progressing   Problem: Metabolic: Goal: Ability to maintain appropriate glucose levels will improve Outcome: Progressing   Problem: Clinical Measurements: Goal: Ability to maintain clinical measurements within normal limits will improve Outcome: Progressing   Problem: Clinical Measurements: Goal: Ability to avoid or minimize complications of infection will improve Outcome: Progressing   Problem: Skin Integrity: Goal: Skin integrity will improve Outcome: Progressing   "

## 2024-03-03 NOTE — Discharge Summary (Signed)
 " Physician Discharge Summary   Patient: Jack Owens MRN: [id number] DOB: Apr 15, 1965  Admit date:     02/26/2024  Discharge date: 03/03/24  Discharge Physician: Amaryllis Dare   PCP: Trudy Herring, MD   Recommendations at discharge:  Please obtain CBC and BMP on follow-up Follow-up with primary care provider within a week-patient with A1c of 6.6 and need to be started on diabetic medications Follow-up at the wound care Please ensure completion of antibiotics Please ensure patient is doing outpatient physical therapy Please ensure weight loss  Discharge Diagnoses: Principal Problem:   Cellulitis Active Problems:   Chronic acquired lymphedema   Diabetes mellitus without complication (HCC)   Hyperlipidemia   Morbid obesity with BMI of 70 and over, adult [facility])   Hospital Course: Jack Owens is a 59 y.o. year old male with medical history of hypertension, hyperlipidemia, type 2 diabetes, severe obesity with BMI greater than 80 presenting for recurrent cellulitis. Patient was hospitalized for cellulitis of the left thigh from 12/28 - 01/06. He presented to his PCP office for hospital follow-up and it was noted that his left thigh had developed cellulitis. Patient reports he has been living in a [place] his friends backyard. He has been unable to care for his left thigh.  Patient was seen at PCP office and they sent him to ED.  On presentation patient was hemodynamically stable, labs without any leukocytosis.  Did noted to had intermittent hyperglycemia with A1c of 6.6.  Per patient he cannot tolerate metformin, he was instructed to follow-up with primary care provider for control of diabetes as it will cause recurrent infection.  Patient has chronic lymphedema with BMI above 70 which makes him high risk for complications.  Patient is homeless and unable to provide wound care, he was set up at a wound care clinic.  Patient received IV antibiotics followed by cefadroxil  and was given 7  more doses to complete the course.  Patient does have some superficial ulcers likely secondary to some pressure injuries.  They were present on admission.  Wound care instructions were provided.  We tried sending him to rehab for wound care but unfortunately unable to do so because of his insurance and morbid obesity.  Patient was given resources for homeless shelters.  He will continue on current medications and need to have a close follow-up with his providers for further assistance.  Patient will remain high risk for readmission because of his living situation and being morbidly obese.       Consultants: None Procedures performed: None Disposition: Home Diet recommendation:  Carb modified diet DISCHARGE MEDICATION: Allergies as of 03/03/2024   No Known Allergies      Medication List     STOP taking these medications    citalopram  40 MG tablet Commonly known as: CELEXA        TAKE these medications    acetaminophen  325 MG tablet Commonly known as: TYLENOL  Take 2 tablets (650 mg total) by mouth every 6 (six) hours as needed for mild pain (pain score 1-3) or fever (or Fever >/= 101).   buPROPion  150 MG 24 hr tablet Commonly known as: WELLBUTRIN  XL Take 150 mg by mouth daily.   cefadroxil  500 MG capsule Commonly known as: DURICEF Take 2 capsules (1,000 mg total) by mouth 2 (two) times daily for 7 doses.   escitalopram  20 MG tablet Commonly known as: LEXAPRO  Take 1 tablet (20 mg total) by mouth daily. Start taking on: March 04, 2024  furosemide  40 MG tablet Commonly known as: LASIX  Take 1 tablet (40 mg total) by mouth daily.   Gerhardt's butt cream Crea Apply to groin and inner thigh twice a day   lovastatin 40 MG tablet Commonly known as: MEVACOR Take 40 mg by mouth at bedtime.   solifenacin 10 MG tablet Commonly known as: VESICARE Take 10 mg by mouth daily.               Discharge Care Instructions  (From admission, onward)            Start     Ordered   03/03/24 0000  Discharge wound care:       Comments: Cleanse underneath pannus/abdominal folds with soap and water, dry and apply Interdry AG  as follows: (Order Lawson # [id number]/WD#[id number]) Measure and cut length of InterDry to fit in skin folds that have skin breakdown Tuck InterDry fabric into skin folds in a single layer, allow for 2 inches of overhang from skin edges to allow for wicking to occur May remove to bathe; dry area thoroughly and then tuck into affected areas again Do not apply any creams or ointments when using InterDry DO NOT THROW AWAY FOR 5 DAYS unless soiled with stool DO NOT [facility] product, this will inactivate the silver in the material  New sheet of Interdry should be applied after 5 days of use if patient continues to have skin breakdown 2.  Cleanse inner thighs/groin with soap and water, dry and apply Gerhardt's Butt Cream 2 times daily and prn soiling.  02/26/24 1907     02/26/24 2200    Wound care  Daily      Comments: Cleanse L posterior thigh wound with Vashe, do not rinse. Apply silver hydrofiber (Aquacel AG TI#[id number]/Ojtdnw [id number]) to wound beds daily and secure with silicone foam or ABD pad and clothe tape whichever is preferred   03/03/24 1024            Follow-up Information     Trudy Herring, MD. Schedule an appointment as soon as possible for a visit in 1 week(s).   Specialty: Family Medicine Contact information: [address] [place] [address] [phone]                Discharge Exam: Filed Weights   02/28/24 0500 03/02/24 0500 03/03/24 0500  Weight: (!) 198.1 kg (!) 192.8 kg (!) 191.9 kg   General.  Morbidly obese gentleman, in no acute distress. Pulmonary.  Lungs clear bilaterally, normal respiratory effort. CV.  Regular rate and rhythm, no JVD, rub or murmur. Abdomen.  Soft, nontender, nondistended, BS positive. CNS.  Alert and oriented .  No focal neurologic deficit. Extremities.  No edema,   pulses intact and symmetrical. Psychiatry.  Judgment and insight appears normal.   Condition at discharge: stable  The results of significant diagnostics from this hospitalization (including imaging, microbiology, ancillary and laboratory) are listed below for reference.   Imaging Studies: ECHOCARDIOGRAM COMPLETE Result Date: 02/28/2024    ECHOCARDIOGRAM REPORT   Patient Name:   Jack Owens Date of Exam: 02/28/2024 Medical Rec #:  [id number]       Height:       64.0 in Accession #:    [id number]      Weight:       436.7 lb Date of Birth:  02-01-1965       BSA:          2.726 m Patient Age:    39  years        BP:           119/71 mmHg Patient Gender: M               HR:           70 bpm. Exam Location:  [facility] Procedure: 2D Echo, Cardiac Doppler, Color Doppler and Intracardiac            Opacification Agent (Both Spectral and Color Flow Doppler were            utilized during procedure). Indications:     Abnormal ECG R94.31  History:         Patient has no prior history of Echocardiogram examinations.  Sonographer:     Thedora Louder RDCS, FASE Referring Phys:  [id number] MORENE BATHE Diagnosing Phys: Cara JONETTA Lovelace MD  Sonographer Comments: Technically difficult study due to poor echo windows, no subcostal window, suboptimal apical window, suboptimal parasternal window and patient is obese. Image acquisition challenging due to patient body habitus and Image acquisition  challenging due to respiratory motion. Very challenging study due to patient positioning (supine). IMPRESSIONS  1. Left ventricular ejection fraction, by estimation, is 55 to 60%. The left ventricle has normal function. The left ventricle has no regional wall motion abnormalities. The left ventricular internal cavity size was moderately dilated. Left ventricular diastolic parameters are consistent with Grade II diastolic dysfunction (pseudonormalization).  2. Right ventricular systolic function is normal. The right ventricular size is normal.   3. The mitral valve is normal in structure. No evidence of mitral valve regurgitation.  4. The aortic valve is normal in structure. Aortic valve regurgitation is not visualized. FINDINGS  Left Ventricle: Left ventricular ejection fraction, by estimation, is 55 to 60%. The left ventricle has normal function. The left ventricle has no regional wall motion abnormalities. Definity  contrast agent was given IV to delineate the left ventricular  endocardial borders. Strain was performed and the global longitudinal strain is indeterminate. The left ventricular internal cavity size was moderately dilated. There is no left ventricular hypertrophy. Left ventricular diastolic parameters are consistent with Grade II diastolic dysfunction (pseudonormalization). Right Ventricle: The right ventricular size is normal. No increase in right ventricular wall thickness. Right ventricular systolic function is normal. Left Atrium: Left atrial size was normal in size. Right Atrium: Right atrial size was normal in size. Pericardium: There is no evidence of pericardial effusion. Mitral Valve: The mitral valve is normal in structure. No evidence of mitral valve regurgitation. Tricuspid Valve: The tricuspid valve is normal in structure. Tricuspid valve regurgitation is not demonstrated. Aortic Valve: The aortic valve is normal in structure. Aortic valve regurgitation is not visualized. Pulmonic Valve: The pulmonic valve was normal in structure. Pulmonic valve regurgitation is not visualized. Aorta: The aortic root was not well visualized. IAS/Shunts: No atrial level shunt detected by color flow Doppler. Additional Comments: 3D was performed not requiring image post processing on an independent workstation and was indeterminate.  LEFT VENTRICLE PLAX 2D LVIDd:         5.40 cm   Diastology LVIDs:         3.70 cm   LV e' lateral:   5.11 cm/s LV PW:         0.90 cm   LV E/e' lateral: 17.1 LV IVS:        0.90 cm LVOT diam:     2.00 cm LV SV:          43 LV  SV Index:   16 LVOT Area:     3.14 cm  LEFT ATRIUM         Index LA diam:    3.70 cm 1.36 cm/m  AORTIC VALVE             PULMONIC VALVE LVOT Vmax:   79.60 cm/s  PV Vmax:        1.13 m/s LVOT Vmean:  46.900 cm/s PV Peak grad:   5.1 mmHg LVOT VTI:    0.136 m     RVOT Peak grad: 4 mmHg  AORTA Ao Root diam: 2.90 cm Ao Asc diam:  3.30 cm MITRAL VALVE MV Area (PHT): 3.45 cm    SHUNTS MV Decel Time: 220 msec    Systemic VTI:  0.14 m MV E velocity: 87.40 cm/s  Systemic Diam: 2.00 cm MV A velocity: 64.70 cm/s MV E/A ratio:  1.35 Cara JONETTA Lovelace MD Electronically signed by Cara JONETTA Lovelace MD Signature Date/Time: 02/28/2024/1:46:01 PM    Final     Microbiology: Results for orders placed or performed during the hospital encounter of 02/26/24  Culture, blood (Routine x 2)     Status: None   Collection Time: 02/26/24  3:06 PM   Specimen: BLOOD  Result Value Ref Range Status   Specimen Description BLOOD BLOOD RIGHT ARM  Final   Special Requests   Final    BOTTLES DRAWN AEROBIC AND ANAEROBIC Blood Culture adequate volume   Culture   Final    NO GROWTH 5 DAYS Performed at [facility], [address] [place], [place] [address]    Report Status 03/02/2024 FINAL  Final  Culture, blood (Routine x 2)     Status: None   Collection Time: 02/26/24  9:47 PM   Specimen: BLOOD  Result Value Ref Range Status   Specimen Description BLOOD BLOOD LEFT ARM  Final   Special Requests   Final    BOTTLES DRAWN AEROBIC AND ANAEROBIC Blood Culture adequate volume   Culture   Final    NO GROWTH 5 DAYS Performed at [facility], [address] [place], [place] [address]    Report Status 03/02/2024 FINAL  Final  C Difficile Quick Screen w PCR reflex     Status: None   Collection Time: 02/27/24  7:35 PM   Specimen: STOOL  Result Value Ref Range Status   C Diff antigen NEGATIVE NEGATIVE Final   C Diff toxin NEGATIVE NEGATIVE Final   C Diff interpretation No C. difficile detected.  Final     Comment: Performed at [facility], [address] [place], [place] [address]  Gastrointestinal Panel by PCR , Stool     Status: None   Collection Time: 02/27/24  7:35 PM   Specimen: Stool  Result Value Ref Range Status   Campylobacter species NOT DETECTED NOT DETECTED Final   Plesimonas shigelloides NOT DETECTED NOT DETECTED Final   Salmonella species NOT DETECTED NOT DETECTED Final   Yersinia enterocolitica NOT DETECTED NOT DETECTED Final   Vibrio species NOT DETECTED NOT DETECTED Final   Vibrio cholerae NOT DETECTED NOT DETECTED Final   Enteroaggregative E coli (EAEC) NOT DETECTED NOT DETECTED Final   Enteropathogenic E coli (EPEC) NOT DETECTED NOT DETECTED Final   Enterotoxigenic E coli (ETEC) NOT DETECTED NOT DETECTED Final   Shiga like toxin producing E coli (STEC) NOT DETECTED NOT DETECTED Final   Shigella/Enteroinvasive E coli (EIEC) NOT DETECTED NOT DETECTED Final   Cryptosporidium NOT DETECTED  NOT DETECTED Final   Cyclospora cayetanensis NOT DETECTED NOT DETECTED Final   Entamoeba histolytica NOT DETECTED NOT DETECTED Final   Giardia lamblia NOT DETECTED NOT DETECTED Final   Adenovirus F40/41 NOT DETECTED NOT DETECTED Final   Astrovirus NOT DETECTED NOT DETECTED Final   Norovirus GI/GII NOT DETECTED NOT DETECTED Final   Rotavirus A NOT DETECTED NOT DETECTED Final   Sapovirus (I, II, IV, and V) NOT DETECTED NOT DETECTED Final    Comment: Performed at [facility], [address] [place], [place] [address]    Labs: CBC: Recent Labs  Lab 02/26/24 1506 02/27/24 0809 03/01/24 0450 03/02/24 1051  WBC 9.1 7.4 7.1 7.5  NEUTROABS 5.4  --  4.0 4.8  HGB 12.3* 10.7* 11.3* 11.6*  HCT 40.1 34.5* 36.8* 37.8*  MCV 90.5 89.4 90.9 90.2  PLT 413* 323 339 359   Basic Metabolic Panel: Recent Labs  Lab 02/26/24 1505 02/26/24 1506 02/27/24 0809 03/01/24 0450 03/02/24 1051  NA  --  139 140 139 139  K  --  4.2 4.0 5.0 5.0  CL  --  101 105 99 97*  CO2  --   29 25 29  32  GLUCOSE  --  109* 127* 166* 179*  BUN  --  12 10 15 18   CREATININE  --  0.70 0.71 0.75 0.75  CALCIUM  --  9.5 8.5* 9.4 9.6  MG 2.0  --   --   --   --    Liver Function Tests: Recent Labs  Lab 02/26/24 1506  AST 19  ALT 14  ALKPHOS 91  BILITOT 0.3  PROT 8.3*  ALBUMIN 4.3   CBG: Recent Labs  Lab 03/02/24 0741 03/02/24 1227 03/02/24 1704 03/02/24 2120 03/03/24 0808  GLUCAP 114* 134* 120* 111* 192*    Discharge time spent: greater than 30 minutes.  This record has been created using Conservation officer, historic buildings. Errors have been sought and corrected,but may not always be located. Such creation errors do not reflect on the standard of care.   Signed: Amaryllis Dare, MD [facility] 03/03/2024 "

## 2024-03-07 ENCOUNTER — Encounter: Admitting: Physician Assistant

## 2024-03-22 ENCOUNTER — Encounter: Admitting: Physician Assistant

## 2024-04-05 ENCOUNTER — Encounter: Admitting: Physician Assistant

## 2024-04-06 ENCOUNTER — Encounter: Admitting: Physician Assistant
